# Patient Record
Sex: Female | Born: 1985 | Race: White | Hispanic: No | Marital: Married | Smoking: Never smoker
Health system: Southern US, Community
[De-identification: ages and names within clinical notes are randomized; demographics above are authoritative.]

## PROBLEM LIST (undated history)

## (undated) ENCOUNTER — Inpatient Hospital Stay (HOSPITAL_COMMUNITY): Payer: Self-pay

## (undated) DIAGNOSIS — F419 Anxiety disorder, unspecified: Secondary | ICD-10-CM

## (undated) DIAGNOSIS — O26649 Intrahepatic cholestasis of pregnancy, unspecified trimester: Secondary | ICD-10-CM

## (undated) DIAGNOSIS — K831 Obstruction of bile duct: Secondary | ICD-10-CM

## (undated) DIAGNOSIS — L709 Acne, unspecified: Secondary | ICD-10-CM

## (undated) DIAGNOSIS — B159 Hepatitis A without hepatic coma: Secondary | ICD-10-CM

## (undated) DIAGNOSIS — J45909 Unspecified asthma, uncomplicated: Secondary | ICD-10-CM

## (undated) HISTORY — DX: Anxiety disorder, unspecified: F41.9

## (undated) HISTORY — DX: Acne, unspecified: L70.9

## (undated) HISTORY — DX: Intrahepatic cholestasis of pregnancy, unspecified trimester: O26.649

## (undated) HISTORY — DX: Unspecified asthma, uncomplicated: J45.909

## (undated) HISTORY — PX: OTHER SURGICAL HISTORY: SHX169

## (undated) HISTORY — DX: Obstruction of bile duct: K83.1

## (undated) HISTORY — DX: Hepatitis a without hepatic coma: B15.9

## (undated) HISTORY — PX: WISDOM TOOTH EXTRACTION: SHX21

---

## 2013-11-28 ENCOUNTER — Telehealth: Payer: Self-pay | Admitting: Family Medicine

## 2013-11-28 NOTE — Telephone Encounter (Signed)
According to our old records system this patient has not been seen within the past 3 years and therefore would need to establish care during regular business hours before we can provide care during extended hours.

## 2013-11-28 NOTE — Telephone Encounter (Signed)
Correction: patient has been seen within past 3 years. Symptoms began last night with throat irritation and facial pressure. Suggested antihistamine, pseudoephedrine, and Advil OTC to treat symptoms. Increase fluid intake and avoid dairy. Virus vs allergies most likely at this point. Virus will worsen around the 4th day and then start to improve. If symptoms worsen or fail to improve she should be seen to r/o bacterial infection. Patient stated understanding and agreement to plan.

## 2013-11-28 NOTE — Telephone Encounter (Signed)
Left message for patient to return call. No visit within the past year. Does she have another PCP? If no and she has been seen within the past 3 years then she is eligible to be seen during Saturday clinic hours.

## 2014-04-30 ENCOUNTER — Ambulatory Visit (INDEPENDENT_AMBULATORY_CARE_PROVIDER_SITE_OTHER): Payer: Managed Care, Other (non HMO) | Admitting: Nurse Practitioner

## 2014-04-30 ENCOUNTER — Encounter: Payer: Self-pay | Admitting: Nurse Practitioner

## 2014-04-30 ENCOUNTER — Encounter (INDEPENDENT_AMBULATORY_CARE_PROVIDER_SITE_OTHER): Payer: Self-pay

## 2014-04-30 VITALS — BP 110/69 | HR 82 | Temp 97.7°F | Ht 65.0 in | Wt 159.0 lb

## 2014-04-30 DIAGNOSIS — J069 Acute upper respiratory infection, unspecified: Secondary | ICD-10-CM

## 2014-04-30 DIAGNOSIS — J029 Acute pharyngitis, unspecified: Secondary | ICD-10-CM

## 2014-04-30 MED ORDER — AMOXICILLIN 875 MG PO TABS: 875.0000 mg | ORAL_TABLET | Freq: Two times a day (BID) | ORAL | Status: DC

## 2014-04-30 NOTE — Patient Instructions (Signed)

## 2014-04-30 NOTE — Progress Notes (Signed)
   Subjective:    [name], female    DOB: 12/29/1985, 28 y.o.   MRN: [id number]  Sore throat, sinus pressure, dry cough, chills, for 3 days  Sinusitis This is a new problem. The current episode started in the past 7 days. The problem is unchanged. There has been no fever. The fever has been present for less than 1 day. Her pain is at a severity of 5/10. The pain is mild. Associated symptoms include chills, coughing, headaches, sinus pressure, sneezing and a sore throat. Past treatments include oral decongestants. The treatment provided no relief.      Review of Systems  Constitutional: Positive for chills.  HENT: Positive for sinus pressure, sneezing and sore throat.   Respiratory: Positive for cough.   Neurological: Positive for headaches.       Objective:   Physical Exam  Constitutional: She appears well-developed and well-nourished.  HENT:  Head: Normocephalic.  Right Ear: Tympanic membrane and ear canal normal. There is tenderness.  Left Ear: Tympanic membrane and ear canal normal. There is tenderness.  Mouth/Throat: Uvula is midline and mucous membranes are normal. Oropharyngeal exudate present.  Cardiovascular: Normal rate, regular rhythm and normal heart sounds.   Pulmonary/Chest: Effort normal and breath sounds normal.  Skin: Skin is warm and dry.  Psychiatric: She has a normal mood and affect. Her behavior is normal. Judgment and thought content normal.   BP 110/69  Pulse 82  Temp(Src) 97.7 F (36.5 C) (Oral)  Ht 5\' 5"  (1.651 m)  Wt 159 lb (72.122 kg)  BMI 26.46 kg/m2        Assessment & Plan:   1. Acute pharyngitis, unspecified pharyngitis type   2. Upper respiratory infection, acute    Meds ordered this encounter  Medications  . amoxicillin (AMOXIL) 875 MG tablet    Sig: Take 1 tablet (875 mg total) by mouth 2 (two) times daily.    Dispense:  20 tablet    Refill:  0    Order Specific Question:  Supervising Provider    Answer:   Ernestina PennaMOORE, DONALD W [[id number]]   1. Take meds as prescribed 2. Use a cool mist humidifier especially during the winter months and when heat has been humid. 3. Use saline nose sprays frequently 4. Saline irrigations of the nose can be very helpful if done frequently.  * 4X daily for 1 week*  * Use of a nettie pot can be helpful with this. Follow directions with this* 5. Drink plenty of fluids 6. Keep thermostat turn down low 7.For any cough or congestion  Use plain Mucinex- regular strength or max strength is fine   * Children- consult with Pharmacist for dosing 8. For fever or aces or pains- take tylenol or ibuprofen appropriate for age and weight.  * for fevers greater than 101 orally you may alternate ibuprofen and tylenol every  3 hours.   Mary-Margaret Daphine DeutscherMartin, FNP

## 2014-07-27 ENCOUNTER — Emergency Department (HOSPITAL_COMMUNITY)
Admission: EM | Admit: 2014-07-27 | Discharge: 2014-07-28 | Disposition: A | Discharge status: Home / Self Care | Payer: Managed Care, Other (non HMO) | Attending: Emergency Medicine | Admitting: Emergency Medicine

## 2014-07-27 ENCOUNTER — Encounter (HOSPITAL_COMMUNITY): Payer: Self-pay | Admitting: Emergency Medicine

## 2014-07-27 DIAGNOSIS — Z792 Long term (current) use of antibiotics: Secondary | ICD-10-CM | POA: Diagnosis not present

## 2014-07-27 DIAGNOSIS — R1010 Upper abdominal pain, unspecified: Secondary | ICD-10-CM | POA: Insufficient documentation

## 2014-07-27 DIAGNOSIS — Z3202 Encounter for pregnancy test, result negative: Secondary | ICD-10-CM | POA: Diagnosis not present

## 2014-07-27 DIAGNOSIS — R079 Chest pain, unspecified: Secondary | ICD-10-CM | POA: Diagnosis present

## 2014-07-27 DIAGNOSIS — Z79899 Other long term (current) drug therapy: Secondary | ICD-10-CM | POA: Diagnosis not present

## 2014-07-27 DIAGNOSIS — R109 Unspecified abdominal pain: Secondary | ICD-10-CM

## 2014-07-27 LAB — URINALYSIS, ROUTINE W REFLEX MICROSCOPIC
BILIRUBIN URINE: NEGATIVE
Glucose, UA: NEGATIVE mg/dL
Hgb urine dipstick: NEGATIVE
KETONES UR: 15 mg/dL — AB
Leukocytes, UA: NEGATIVE
NITRITE: NEGATIVE
PH: 7 (ref 5.0–8.0)
Protein, ur: NEGATIVE mg/dL
SPECIFIC GRAVITY, URINE: 1.022 (ref 1.005–1.030)
UROBILINOGEN UA: 0.2 mg/dL (ref 0.0–1.0)

## 2014-07-27 LAB — CBC WITH DIFFERENTIAL/PLATELET
BASOS ABS: 0 10*3/uL (ref 0.0–0.1)
BASOS PCT: 0 % (ref 0–1)
EOS ABS: 0.2 10*3/uL (ref 0.0–0.7)
Eosinophils Relative: 1 % (ref 0–5)
HCT: 40.2 % (ref 36.0–46.0)
HEMOGLOBIN: 13.5 g/dL (ref 12.0–15.0)
Lymphocytes Relative: 41 % (ref 12–46)
Lymphs Abs: 4.8 10*3/uL — ABNORMAL HIGH (ref 0.7–4.0)
MCH: 29.7 pg (ref 26.0–34.0)
MCHC: 33.6 g/dL (ref 30.0–36.0)
MCV: 88.4 fL (ref 78.0–100.0)
Monocytes Absolute: 0.6 10*3/uL (ref 0.1–1.0)
Monocytes Relative: 5 % (ref 3–12)
NEUTROS ABS: 6.2 10*3/uL (ref 1.7–7.7)
NEUTROS PCT: 53 % (ref 43–77)
PLATELETS: 294 10*3/uL (ref 150–400)
RBC: 4.55 MIL/uL (ref 3.87–5.11)
RDW: 12.8 % (ref 11.5–15.5)
WBC: 11.8 10*3/uL — ABNORMAL HIGH (ref 4.0–10.5)

## 2014-07-27 LAB — COMPREHENSIVE METABOLIC PANEL
ALK PHOS: 60 U/L (ref 39–117)
ALT: 17 U/L (ref 0–35)
AST: 16 U/L (ref 0–37)
Albumin: 3.9 g/dL (ref 3.5–5.2)
Anion gap: 14 (ref 5–15)
BILIRUBIN TOTAL: 0.3 mg/dL (ref 0.3–1.2)
BUN: 12 mg/dL (ref 6–23)
CO2: 21 mEq/L (ref 19–32)
Calcium: 9.1 mg/dL (ref 8.4–10.5)
Chloride: 103 mEq/L (ref 96–112)
Creatinine, Ser: 0.83 mg/dL (ref 0.50–1.10)
GFR calc Af Amer: >90 mL/min (ref >90)
GFR calc non Af Amer: >90 mL/min (ref >90)
Glucose, Bld: 94 mg/dL (ref 70–99)
POTASSIUM: 3.9 meq/L (ref 3.7–5.3)
Sodium: 138 mEq/L (ref 137–147)
TOTAL PROTEIN: 7.5 g/dL (ref 6.0–8.3)

## 2014-07-27 LAB — POC URINE PREG, ED: Preg Test, Ur: NEGATIVE

## 2014-07-27 LAB — I-STAT TROPONIN, ED: Troponin i, poc: 0 ng/mL (ref 0.00–0.08)

## 2014-07-27 LAB — LIPASE, BLOOD: LIPASE: 27 U/L (ref 11–59)

## 2014-07-27 MED ORDER — ONDANSETRON HCL 4 MG/2ML IJ SOLN
4.0000 mg | Freq: Once | INTRAMUSCULAR | Status: AC
Start: 2014-07-27 — End: 2014-07-27
  Administered 2014-07-27: 4 mg via INTRAVENOUS
  Filled 2014-07-27: qty 2

## 2014-07-27 NOTE — ED Notes (Signed)
Patient reports on Saturday morning she developed left upper quad pain that radiates to right upper quad and back. Pt describes pain as sharp, stabbing, intermittent at times. Eating makes the pain worse. Nausea with no vomiting.

## 2014-07-27 NOTE — ED Provider Notes (Signed)
CSN: [id number]     Arrival date & time 07/27/14  1908 History   First MD Initiated Contact with Patient 07/27/14 2153     Chief Complaint  Patient presents with  . Abdominal Pain  . Chest Pain  . Nausea     (Consider location/radiation/quality/duration/timing/severity/associated sxs/prior Treatment) HPI Comments: Patient presents to the emergency department with chief complaint of upper abdominal pain. She states the pain started on Saturday morning. She states that radiates to her back. She describes as being intermittent, sharp, and stabbing. She states that it is worsened with eating. She denies any nausea or vomiting. Denies any diarrhea, constipation, or dysuria. She has not tried taking anything to alleviate her symptoms. No prior abdominal surgeries. No pertinent past medical history.  The history is provided by the patient. No language interpreter was used.    History reviewed. No pertinent past medical history. History reviewed. No pertinent past surgical history. No family history on file. History  Substance Use Topics  . Smoking status: Never Smoker   . Smokeless tobacco: Not on file  . Alcohol Use: No   OB History    No data available     Review of Systems  Constitutional: Negative for fever and chills.  Respiratory: Negative for shortness of breath.   Cardiovascular: Negative for chest pain.  Gastrointestinal: Negative for nausea, vomiting, diarrhea and constipation.  Genitourinary: Negative for dysuria.  All other systems reviewed and are negative.     Allergies  Sulfur  Home Medications   Prior to Admission medications   Medication Sig Start Date End Date Taking? Authorizing Provider  ibuprofen (ADVIL,MOTRIN) 200 MG tablet Take 400 mg by mouth every 6 (six) hours as needed.   Yes Historical Provider, MD  Levonorgestrel-Ethinyl Estrad (SRONYX PO) Take 1 tablet by mouth daily.   Yes Historical Provider, MD  amoxicillin (AMOXIL) 875 MG tablet Take 1  tablet (875 mg total) by mouth 2 (two) times daily. 04/30/14   Mary-Margaret Daphine DeutscherMartin, FNP   BP 116/72 mmHg  Pulse 83  Temp(Src) 98.1 F (36.7 C) (Oral)  Resp 16  SpO2 98%  LMP 07/13/2014 Physical Exam  Constitutional: She is oriented to person, place, and time. She appears well-developed and well-nourished.  HENT:  Head: Normocephalic and atraumatic.  Eyes: Conjunctivae and EOM are normal. Pupils are equal, round, and reactive to light.  Neck: Normal range of motion. Neck supple.  Cardiovascular: Normal rate and regular rhythm.  Exam reveals no gallop and no friction rub.   No murmur heard. Pulmonary/Chest: Effort normal and breath sounds normal. No respiratory distress. She has no wheezes. She has no rales. She exhibits no tenderness.  Abdominal: Soft. Bowel sounds are normal. She exhibits no distension and no mass. There is no tenderness. There is no rebound and no guarding.  No focal abdominal tenderness, no RLQ tenderness or pain at McBurney's point, no RUQ tenderness or Murphy's sign, no left-sided abdominal tenderness, no fluid wave, or signs of peritonitis   Musculoskeletal: Normal range of motion. She exhibits no edema or tenderness.  Neurological: She is alert and oriented to person, place, and time.  Skin: Skin is warm and dry.  Psychiatric: She has a normal mood and affect. Her behavior is normal. Judgment and thought content normal.  Nursing note and vitals reviewed.   ED Course  Procedures (including critical care time) Results for orders placed or performed during the hospital encounter of 07/27/14  CBC with Differential  Result Value Ref Range   WBC  11.8 (H) 4.0 - 10.5 K/uL   RBC 4.55 3.87 - 5.11 MIL/uL   Hemoglobin 13.5 12.0 - 15.0 g/dL   HCT 16.140.2 09.636.0 - 04.546.0 %   MCV 88.4 78.0 - 100.0 fL   MCH 29.7 26.0 - 34.0 pg   MCHC 33.6 30.0 - 36.0 g/dL   RDW 40.912.8 81.111.5 - 91.415.5 %   Platelets 294 150 - 400 K/uL   Neutrophils Relative % 53 43 - 77 %   Neutro Abs 6.2 1.7 - 7.7  K/uL   Lymphocytes Relative 41 12 - 46 %   Lymphs Abs 4.8 (H) 0.7 - 4.0 K/uL   Monocytes Relative 5 3 - 12 %   Monocytes Absolute 0.6 0.1 - 1.0 K/uL   Eosinophils Relative 1 0 - 5 %   Eosinophils Absolute 0.2 0.0 - 0.7 K/uL   Basophils Relative 0 0 - 1 %   Basophils Absolute 0.0 0.0 - 0.1 K/uL  Comprehensive metabolic panel  Result Value Ref Range   Sodium 138 137 - 147 mEq/L   Potassium 3.9 3.7 - 5.3 mEq/L   Chloride 103 96 - 112 mEq/L   CO2 21 19 - 32 mEq/L   Glucose, Bld 94 70 - 99 mg/dL   BUN 12 6 - 23 mg/dL   Creatinine, Ser 7.820.83 0.50 - 1.10 mg/dL   Calcium 9.1 8.4 - 95.610.5 mg/dL   Total Protein 7.5 6.0 - 8.3 g/dL   Albumin 3.9 3.5 - 5.2 g/dL   AST 16 0 - 37 U/L   ALT 17 0 - 35 U/L   Alkaline Phosphatase 60 39 - 117 U/L   Total Bilirubin 0.3 0.3 - 1.2 mg/dL   GFR calc non Af Amer >90 >90 mL/min   GFR calc Af Amer >90 >90 mL/min   Anion gap 14 5 - 15  Lipase, blood  Result Value Ref Range   Lipase 27 11 - 59 U/L  Urinalysis, Routine w reflex microscopic  Result Value Ref Range   Color, Urine YELLOW YELLOW   APPearance CLOUDY (A) CLEAR   Specific Gravity, Urine 1.022 1.005 - 1.030   pH 7.0 5.0 - 8.0   Glucose, UA NEGATIVE NEGATIVE mg/dL   Hgb urine dipstick NEGATIVE NEGATIVE   Bilirubin Urine NEGATIVE NEGATIVE   Ketones, ur 15 (A) NEGATIVE mg/dL   Protein, ur NEGATIVE NEGATIVE mg/dL   Urobilinogen, UA 0.2 0.0 - 1.0 mg/dL   Nitrite NEGATIVE NEGATIVE   Leukocytes, UA NEGATIVE NEGATIVE  I-stat troponin, ED (only if pt is 27 y.o. or older & pain is above umbilicus) - [facility]  Result Value Ref Range   Troponin i, poc 0.00 0.00 - 0.08 ng/mL   Comment 3          POC Urine Pregnancy, ED  (If Pre-menopausal female) - [facility]  Result Value Ref Range   Preg Test, Ur NEGATIVE NEGATIVE   No results found.   Imaging Review No results found.   EKG Interpretation None      MDM   Final diagnoses:  Abdominal pain, unspecified abdominal  location    Patient with upper abdominal pain. Worsened with eating. No focal abdominal pain now. Patient is asymptomatic currently. Suspect GERD/ulcer. Plan for treatment with omeprazole. No focal abdominal tenderness. Vitals are all reassuring. Patient is well-appearing, not in a parent distress.   Roxy Horsemanobert Ladesha Pacini, PA-C 07/28/14 0003

## 2014-07-27 NOTE — ED Notes (Signed)
Pt initially had LUQ pain that started Saturday and now the abd pain radiates to RUQ and up sternum after patient eats.  Pt states it will last for little while then subside. Pt states she is nauseated also but denies v/d.

## 2014-07-27 NOTE — ED Notes (Signed)
Last meal: 12:30pm

## 2014-07-28 MED ORDER — OMEPRAZOLE 20 MG PO CPDR: 20.0000 mg | DELAYED_RELEASE_CAPSULE | Freq: Every day | ORAL | Status: DC

## 2014-07-28 NOTE — Discharge Instructions (Signed)

## 2015-02-25 ENCOUNTER — Ambulatory Visit (INDEPENDENT_AMBULATORY_CARE_PROVIDER_SITE_OTHER): Payer: Managed Care, Other (non HMO) | Admitting: Family Medicine

## 2015-02-25 ENCOUNTER — Ambulatory Visit (INDEPENDENT_AMBULATORY_CARE_PROVIDER_SITE_OTHER): Payer: Managed Care, Other (non HMO)

## 2015-02-25 ENCOUNTER — Encounter: Payer: Self-pay | Admitting: Family Medicine

## 2015-02-25 VITALS — BP 108/71 | HR 74 | Temp 99.4°F | Ht 65.0 in | Wt 157.0 lb

## 2015-02-25 DIAGNOSIS — M25571 Pain in right ankle and joints of right foot: Secondary | ICD-10-CM

## 2015-02-25 NOTE — Patient Instructions (Signed)
Ankle Exercises for Rehabilitation Following ankle injuries, it is as important to follow your caregiver's instructions for regaining full use of your ankle as it was to follow the initial treatment plan following the injury. The following are some suggestions for exercises and treatment, which can be done to help you regain full use of your ankle as soon as possible.  Follow all instructions regarding physical therapy.  Before exercising, it may be helpful to use heat on the muscles or joint being exercised. This loosens up the muscles and tendons (cordlike structure) and decreases chances of injury during your exercises. If this is not possible, just begin your exercises slowly to gradually warm up.  Stand on your toes several times per day to strengthen the calf muscles. These are the muscles in the back of your leg between the knee and the heel. The cord you can feel just above the heel is the Achilles tendon. Rise up on your toes several times repeating this three to four times per day. Do not exercise to the point of pain. If pain starts to develop, decrease the exercise until you are comfortable again.  Do range of motion exercises. This means moving the ankle in all directions. Practice writing the alphabet with your toes in the air. Do not increase beyond a range that is comfortable.  Increase the strength of the muscles in the front of your leg by raising your toes and foot straight up in the air. Repeat this exercise as you did the calf exercise with the same warnings. This also help to stretch your muscles.  Stretch your calf muscles also by leaning against a wall with your hands in front of you. Put your feet a few feet from the wall and bend your knees until you feel the muscles in your calves become tight.  After exercising it may be helpful to put ice on the ankle to prevent swelling and improve rehabilitation. This may be done for 15 to 20 minutes following your exercises. If  exercising is being done in the workplace, this may not always be possible.  Taping an ankle injury may be helpful to give added support following an injury. It also may help prevent reinjury. This may be true if you are in training or in a conditioning program. You and your caregiver can decide on the best course of action to follow. Document Released: 09/08/2000 Document Revised: 01/26/2014 Document Reviewed: 09/05/2008 Exit[employer] Patient Information 2015 [employer]. This information is not intended to replace advice given to you by your health care provider. Make sure you discuss any questions you have with your health care provider.  

## 2015-02-25 NOTE — Progress Notes (Signed)
   Subjective:    Patient ID: Alyssa MichaelisAlison Bullins [name], female    DOB: 05/16/1986, 29 y.o.   MRN: [id number]  HPI 29 year old female who was playing with her husband and kicked him striking her right lateral foot ankle on his knee. This was about 3 months ago. It still causes pain when she stands on it all day.    Review of Systems  Constitutional: Negative.   HENT: Negative.   Eyes: Negative.   Respiratory: Negative.   Cardiovascular: Negative.   Gastrointestinal: Negative.   Endocrine: Negative.   Genitourinary: Negative.   Hematological: Negative.   Psychiatric/Behavioral: Negative.    There are no active problems to display for this patient.  Outpatient Encounter Prescriptions as of 02/25/2015  Medication Sig  . doxycycline (DORYX) 100 MG EC tablet Take 100 mg by mouth daily.  Marland Kitchen. tretinoin (RETIN-A) 0.05 % cream   . [DISCONTINUED] amoxicillin (AMOXIL) 875 MG tablet Take 1 tablet (875 mg total) by mouth 2 (two) times daily.  . [DISCONTINUED] ibuprofen (ADVIL,MOTRIN) 200 MG tablet Take 400 mg by mouth every 6 (six) hours as needed.  . [DISCONTINUED] Levonorgestrel-Ethinyl Estrad (SRONYX PO) Take 1 tablet by mouth daily.  . [DISCONTINUED] omeprazole (PRILOSEC) 20 MG capsule Take 1 capsule (20 mg total) by mouth daily.   No facility-administered encounter medications on file as of 02/25/2015.       Objective:   Physical Exam  Constitutional: She appears well-developed and well-nourished.  Musculoskeletal:  Exam of right ankle and foot there is no swelling or discoloration wrist tenderness around the lateral malleolus really inferior to it. The fifth metatarsal does not seem tender to palpation. There is normal strength in all planes around the foot and ankle          Assessment & Plan:  1. Pain in joint, ankle and foot, right X-ray is negative. I suspect this represents contusion and possibly some sprain and strain of soft tissues. We discussed some range of motion as well as  exercises and given instructions as well when use ice as needed for pain and Tylenol rather than NSAIDs  Frederica KusterStephen M Vernette Moise MD - DG Ankle Complete Right; Future

## 2016-06-13 ENCOUNTER — Encounter: Payer: Self-pay | Admitting: Physician Assistant

## 2016-06-13 ENCOUNTER — Ambulatory Visit (INDEPENDENT_AMBULATORY_CARE_PROVIDER_SITE_OTHER): Payer: Managed Care, Other (non HMO) | Admitting: Physician Assistant

## 2016-06-13 ENCOUNTER — Encounter (INDEPENDENT_AMBULATORY_CARE_PROVIDER_SITE_OTHER): Payer: Self-pay

## 2016-06-13 ENCOUNTER — Ambulatory Visit: Payer: Managed Care, Other (non HMO) | Admitting: Physician Assistant

## 2016-06-13 VITALS — BP 116/72 | HR 86 | Temp 98.3°F | Ht 65.0 in | Wt 161.4 lb

## 2016-06-13 DIAGNOSIS — Z8709 Personal history of other diseases of the respiratory system: Secondary | ICD-10-CM

## 2016-06-13 DIAGNOSIS — J209 Acute bronchitis, unspecified: Secondary | ICD-10-CM | POA: Diagnosis not present

## 2016-06-13 MED ORDER — ALBUTEROL SULFATE (2.5 MG/3ML) 0.083% IN NEBU: 2.5000 mg | INHALATION_SOLUTION | Freq: Four times a day (QID) | RESPIRATORY_TRACT | 1 refills | Status: DC | PRN

## 2016-06-13 MED ORDER — DOXYCYCLINE HYCLATE 100 MG PO TBEC: 100.0000 mg | DELAYED_RELEASE_TABLET | Freq: Two times a day (BID) | ORAL | 0 refills | Status: DC

## 2016-06-13 MED ORDER — BENZONATATE 200 MG PO CAPS: 200.0000 mg | ORAL_CAPSULE | Freq: Three times a day (TID) | ORAL | 0 refills | Status: DC | PRN

## 2016-06-13 NOTE — Patient Instructions (Signed)

## 2016-06-13 NOTE — Progress Notes (Signed)
BP 116/72   Pulse 86   Temp 98.3 F (36.8 C) (Oral)   Ht 5\' 5"  (1.651 m)   Wt 161 lb 6.4 oz (73.2 kg)   BMI 26.86 kg/m    Subjective:    [name], female    DOB: 10/17/1985, 30 y.o.   MRN: [id number]  HPI: Alyssa [name] is a 30 y.o. female presenting on 06/13/2016 for Cough; stuffy head; and Headache  Sick greater than one week with cough worsening. States that it feels like when she was young and had tightness with breathing from her asthma. Has mostly grown out of the asthma.  Has had chills and aching. The cough is now productive.  No NVD.  Relevant past medical, surgical, family and social history reviewed and updated as indicated. Interim medical history since our last visit reviewed. Allergies and medications reviewed and updated. DATA REVIEWED: CHART IN EPIC  Social History   Social History  . Marital status: Married    Spouse name: N/A  . Number of children: N/A  . Years of education: N/A   Occupational History  . Not on file.   Social History Main Topics  . Smoking status: Never Smoker  . Smokeless tobacco: Never Used  . Alcohol use No  . Drug use: No  . Sexual activity: Not on file   Other Topics Concern  . Not on file   Social History Narrative  . No narrative on file    History reviewed. No pertinent surgical history.  Family History  Problem Relation Age of Onset  . Healthy Mother   . Healthy Father   . Healthy Sister     Review of Systems  Constitutional: Negative.  Negative for activity change, fatigue and fever.  HENT: Positive for congestion, ear pain, rhinorrhea, sinus pressure and sore throat.   Eyes: Negative.   Respiratory: Positive for cough and wheezing. Negative for shortness of breath and stridor.   Cardiovascular: Negative.  Negative for chest pain.  Gastrointestinal: Negative.  Negative for abdominal pain.  Endocrine: Negative.   Genitourinary: Negative.  Negative for dysuria.    Musculoskeletal: Negative.   Skin: Negative.   Neurological: Negative.       Medication List       Accurate as of 06/13/16  3:14 PM. Always use your most recent med list.          albuterol (2.5 MG/3ML) 0.083% nebulizer solution Commonly known as:  PROVENTIL Take 3 mLs (2.5 mg total) by nebulization every 6 (six) hours as needed for wheezing or shortness of breath.   benzonatate 200 MG capsule Commonly known as:  TESSALON Take 1 capsule (200 mg total) by mouth 3 (three) times daily as needed for cough.   doxycycline 100 MG EC tablet Commonly known as:  DORYX Take 1 tablet (100 mg total) by mouth 2 (two) times daily.          Objective:    BP 116/72   Pulse 86   Temp 98.3 F (36.8 C) (Oral)   Ht 5\' 5"  (1.651 m)   Wt 161 lb 6.4 oz (73.2 kg)   BMI 26.86 kg/m   Allergies  Allergen Reactions  . Sulfur Rash    Wt Readings from Last 3 Encounters:  06/13/16 161 lb 6.4 oz (73.2 kg)  02/25/15 157 lb (71.2 kg)  04/30/14 159 lb (72.1 kg)    Physical Exam  Constitutional: She is oriented to person, place, and time. She appears  well-developed and well-nourished.  HENT:  Head: Normocephalic and atraumatic.  Right Ear: There is drainage and tenderness. A middle ear effusion is present.  Left Ear: There is drainage and tenderness. A middle ear effusion is present.  Nose: Mucosal edema and rhinorrhea present. Right sinus exhibits maxillary sinus tenderness. Left sinus exhibits maxillary sinus tenderness.  Mouth/Throat: Oropharyngeal exudate and posterior oropharyngeal erythema present.  Eyes: Conjunctivae and EOM are normal. Pupils are equal, round, and reactive to light.  Neck: Normal range of motion. Neck supple.  Cardiovascular: Normal rate, regular rhythm, normal heart sounds and intact distal pulses.   Pulmonary/Chest: Effort normal. She has wheezes in the right upper field and the left upper field.  Abdominal: Soft. Bowel sounds are normal.  Neurological: She is  alert and oriented to person, place, and time. She has normal reflexes.  Skin: Skin is warm and dry. No rash noted.  Psychiatric: She has a normal mood and affect. Her behavior is normal. Judgment and thought content normal.  Nursing note and vitals reviewed.       Assessment & Plan:   1. Acute bronchitis, unspecified organism - doxycycline (DORYX) 100 MG EC tablet; Take 1 tablet (100 mg total) by mouth 2 (two) times daily.  Dispense: 20 tablet; Refill: 0 - albuterol (PROVENTIL) (2.5 MG/3ML) 0.083% nebulizer solution; Take 3 mLs (2.5 mg total) by nebulization every 6 (six) hours as needed for wheezing or shortness of breath.  Dispense: 150 mL; Refill: 1 - benzonatate (TESSALON) 200 MG capsule; Take 1 capsule (200 mg total) by mouth 3 (three) times daily as needed for cough.  Dispense: 30 capsule; Refill: 0  2. History of asthma Use albuterol nebulizer QID for cough and wheeze   Continue all other maintenance medications as listed above.  Follow up plan: Return if symptoms worsen or fail to improve.  Educational handout given for bronchitis/  Remus Loffler PA-C [facility] [address]  [place], [place] [address] [phone]   06/13/2016, 3:14 PM

## 2016-10-03 ENCOUNTER — Ambulatory Visit (INDEPENDENT_AMBULATORY_CARE_PROVIDER_SITE_OTHER): Payer: Managed Care, Other (non HMO) | Admitting: Physician Assistant

## 2016-10-03 ENCOUNTER — Encounter: Payer: Self-pay | Admitting: Physician Assistant

## 2016-10-03 VITALS — BP 131/73 | HR 106 | Temp 99.3°F | Ht 65.0 in | Wt 166.2 lb

## 2016-10-03 DIAGNOSIS — L03312 Cellulitis of back [any part except buttock]: Secondary | ICD-10-CM | POA: Diagnosis not present

## 2016-10-03 MED ORDER — CEPHALEXIN 500 MG PO CAPS: 500.0000 mg | ORAL_CAPSULE | Freq: Four times a day (QID) | ORAL | 0 refills | Status: DC

## 2016-10-03 NOTE — Patient Instructions (Signed)

## 2016-10-03 NOTE — Progress Notes (Signed)
BP 131/73   Pulse (!) 106   Temp 99.3 F (37.4 C) (Oral)   Ht 5\' 5"  (1.651 m)   Wt 166 lb 3.2 oz (75.4 kg)   BMI 27.66 kg/m    Subjective:    [name], female    DOB: 13-Dec-1985, 31 y.o.   MRN: [id number]  HPI: Florence Yeung is a 31 y.o. female presenting on 10/03/2016 for Rash (Painful rash- right side of back ) Patient with skin lesion on right upper back over the past 5 days. It started pustular. It did drain and then has healed up. There has been increasing redness around the area. She denies any severe radicular pain she denies fever or chills. There were no vesicular lesions associated with this.  Relevant past medical, surgical, family and social history reviewed and updated as indicated. Allergies and medications reviewed and updated.  Past Medical History:  Diagnosis Date  . Acne     History reviewed. No pertinent surgical history.  Review of Systems  Constitutional: Negative.  Negative for activity change, fatigue and fever.  HENT: Negative.   Eyes: Negative.   Respiratory: Negative.  Negative for cough.   Cardiovascular: Negative.  Negative for chest pain.  Gastrointestinal: Negative.  Negative for abdominal pain.  Endocrine: Negative.   Genitourinary: Negative.  Negative for dysuria.  Musculoskeletal: Negative.   Skin: Negative.   Neurological: Negative.     Allergies as of 10/03/2016      Reactions   Sulfur Rash      Medication List       Accurate as of 10/03/16  3:23 PM. Always use your most recent med list.          ACZONE 7.5 % Gel Generic drug:  Dapsone APPLY TO THE AFFECTED AREA EVERY DAY   albuterol (2.5 MG/3ML) 0.083% nebulizer solution Commonly known as:  PROVENTIL Take 3 mLs (2.5 mg total) by nebulization every 6 (six) hours as needed for wheezing or shortness of breath.   cephALEXin 500 MG capsule Commonly known as:  KEFLEX Take 1 capsule (500 mg total) by mouth 4 (four) times daily.   clindamycin 1 %  gel Commonly known as:  CLINDAGEL APPLY TO THE AFFECTED AREA EVERY NIGHT          Objective:    BP 131/73   Pulse (!) 106   Temp 99.3 F (37.4 C) (Oral)   Ht 5\' 5"  (1.651 m)   Wt 166 lb 3.2 oz (75.4 kg)   BMI 27.66 kg/m   Allergies  Allergen Reactions  . Sulfur Rash    Physical Exam  Constitutional: She is oriented to person, place, and time. She appears well-developed and well-nourished.  HENT:  Head: Normocephalic and atraumatic.  Eyes: Conjunctivae and EOM are normal. Pupils are equal, round, and reactive to light.  Cardiovascular: Normal rate, regular rhythm, normal heart sounds and intact distal pulses.   Pulmonary/Chest: Effort normal and breath sounds normal.  Abdominal: Soft. Bowel sounds are normal.  Neurological: She is alert and oriented to person, place, and time. She has normal reflexes.  Skin: Skin is warm, dry and intact. Rash noted. Rash is maculopapular and pustular. Rash is not vesicular. There is erythema.     Psychiatric: She has a normal mood and affect. Her behavior is normal. Judgment and thought content normal.        Assessment & Plan:   1. Cellulitis of back except buttock - cephALEXin (KEFLEX) 500 MG capsule;  Take 1 capsule (500 mg total) by mouth 4 (four) times daily.  Dispense: 40 capsule; Refill: 0   Continue all other maintenance medications as listed above.  Follow up plan: Return if symptoms worsen or fail to improve.  No orders of the defined types were placed in this encounter.   Educational handout given for cellulitis  Remus LofflerAngel S. Szymon Foiles PA-C [facility] [address]  [place], [place] [id number] [phone]   10/03/2016, 3:23 PM

## 2017-04-03 LAB — OB RESULTS CONSOLE RPR: RPR: NONREACTIVE

## 2017-04-03 LAB — OB RESULTS CONSOLE HEPATITIS B SURFACE ANTIGEN: Hepatitis B Surface Ag: NEGATIVE

## 2017-04-03 LAB — OB RESULTS CONSOLE GC/CHLAMYDIA
CHLAMYDIA, DNA PROBE: NEGATIVE
Gonorrhea: NEGATIVE

## 2017-04-03 LAB — OB RESULTS CONSOLE ABO/RH: RH Type: POSITIVE

## 2017-04-03 LAB — OB RESULTS CONSOLE HIV ANTIBODY (ROUTINE TESTING): HIV: NONREACTIVE

## 2017-04-03 LAB — OB RESULTS CONSOLE RUBELLA ANTIBODY, IGM: RUBELLA: IMMUNE

## 2017-04-03 LAB — OB RESULTS CONSOLE ANTIBODY SCREEN: Antibody Screen: NEGATIVE

## 2017-07-02 ENCOUNTER — Ambulatory Visit (INDEPENDENT_AMBULATORY_CARE_PROVIDER_SITE_OTHER): Payer: 59 | Admitting: Family

## 2017-07-02 ENCOUNTER — Encounter: Payer: Self-pay | Admitting: Family

## 2017-07-02 VITALS — BP 112/66 | HR 90 | Temp 98.3°F | Ht 65.0 in | Wt 179.8 lb

## 2017-07-02 DIAGNOSIS — J189 Pneumonia, unspecified organism: Secondary | ICD-10-CM | POA: Diagnosis not present

## 2017-07-02 DIAGNOSIS — R062 Wheezing: Secondary | ICD-10-CM

## 2017-07-02 MED ORDER — ALBUTEROL SULFATE HFA 108 (90 BASE) MCG/ACT IN AERS: 2.0000 | INHALATION_SPRAY | Freq: Four times a day (QID) | RESPIRATORY_TRACT | 2 refills | Status: DC | PRN

## 2017-07-02 MED ORDER — AMOXICILLIN 400 MG/5ML PO SUSR: 1000.0000 mg | Freq: Two times a day (BID) | ORAL | 0 refills | Status: DC

## 2017-07-02 NOTE — Patient Instructions (Signed)

## 2017-07-02 NOTE — Progress Notes (Signed)
   Subjective:    [name], female    DOB: 20-Apr-1986, 31 y.o.   MRN: [id number]  Pt presents to the office today with cough and wheezing. She is currently [redacted] weeks pregnant.  Cough  This is a new problem. The problem has been gradually improving. The problem occurs every few minutes. The cough is productive of purulent sputum and productive of sputum. Associated symptoms include chills, rhinorrhea, shortness of breath and wheezing. Pertinent negatives include no ear congestion, ear pain, fever, myalgias, nasal congestion, postnasal drip or sore throat. The symptoms are aggravated by lying down. She has tried rest and OTC cough suppressant for the symptoms. The treatment provided mild relief. Her past medical history is significant for asthma.  Abdominal Pain  Pertinent negatives include no fever or myalgias.      Review of Systems  Constitutional: Positive for chills. Negative for fever.  HENT: Positive for rhinorrhea. Negative for ear pain, postnasal drip and sore throat.   Respiratory: Positive for cough, shortness of breath and wheezing.   Gastrointestinal: Positive for abdominal pain.  Musculoskeletal: Negative for myalgias.  All other systems reviewed and are negative.      Objective:   Physical Exam  Constitutional: She is oriented to person, place, and time. She appears well-developed and well-nourished. No distress.  HENT:  Head: Normocephalic and atraumatic.  Right Ear: External ear normal.  Left Ear: External ear normal.  Nose: Mucosal edema and rhinorrhea present.  Mouth/Throat: Oropharynx is clear and moist.  Eyes: Pupils are equal, round, and reactive to light.  Neck: Normal range of motion. Neck supple. No thyromegaly present.  Cardiovascular: Normal rate, regular rhythm, normal heart sounds and intact distal pulses.   No murmur heard. Pulmonary/Chest: Effort normal. No respiratory distress. She has wheezes.  Abdominal: Soft. Bowel sounds are  normal. She exhibits no distension. There is no tenderness.  Musculoskeletal: Normal range of motion. She exhibits no edema or tenderness.  Neurological: She is alert and oriented to person, place, and time. She has normal reflexes. No cranial nerve deficit.  Skin: Skin is warm and dry.  Psychiatric: She has a normal mood and affect. Her behavior is normal. Judgment and thought content normal.  Vitals reviewed.     BP 112/66   Pulse 90   Temp 98.3 F (36.8 C) (Oral)   Ht  (1.651 m)   Wt 179 lb 12.8 oz (81.6 kg)   BMI 29.92 kg/m      Assessment & Plan:  1. Wheezing - amoxicillin (AMOXIL) 400 MG/5ML suspension; Take 12.5 mLs (1,000 mg total) by mouth 2 (two) times daily.  Dispense: 150 mL; Refill: 0 - albuterol (PROVENTIL HFA;VENTOLIN HFA) 108 (90 Base) MCG/ACT inhaler; Inhale 2 puffs into the lungs every 6 (six) hoursas needed for wheezing or shortness of breath.  Dispense: 1 Inhaler; Refill: 2  2. Atypical pneumonia - amoxicillin (AMOXIL) 400 MG/5ML suspension; Take 12.5 mLs (1,000 mg total) by mouth 2 (two) times daily.  Dispense: 150 mL; Refill: 0 - albuterol (PROVENTIL HFA;VENTOLIN HFA) 108 (90 Base) MCG/ACT inhaler; Inhale 2 puffs into the lungs every 6 (six) hours as needed for wheezing or shortness of breath.  Dispense: 1 Inhaler; Refill: 2   Will start on amoxicillin Prednisone is category D will hold off  Since pt is pregnant will not do chest x-ray Force fluids Rest Report any changes in wheezing or fevers RTO prn   Jannifer Rodney, FNP

## 2017-09-12 DIAGNOSIS — K831 Obstruction of bile duct: Secondary | ICD-10-CM | POA: Diagnosis present

## 2017-09-12 DIAGNOSIS — O26649 Intrahepatic cholestasis of pregnancy, unspecified trimester: Secondary | ICD-10-CM | POA: Diagnosis present

## 2017-09-25 NOTE — L&D Delivery Note (Signed)
Delivery Note Pt pushed very well for 10-7415min.  At 3:56 PM a viable and healthy female was delivered via Vaginal, Spontaneous (Presentation: OA; LOT ).  APGAR: 8, 9; weight 6 lb 7.9 oz (2945 g).   Placenta status: delivered, intact .  Cord: 3V with the following complications: none..    Anesthesia:  epidural Episiotomy: None Lacerations: 2nd degree, B labial Suture Repair: 3.0 vicryl rapide Est. Blood Loss (mL): 150cc  Mom to postpartum.  Baby to Couplet care / Skin to Skin.  Shaleka Brines Bovard-Stuckert 10/10/2017, 4:29 PM  B+/RI/Tdap in [facility]/Br/contra?

## 2017-10-01 ENCOUNTER — Telehealth (HOSPITAL_COMMUNITY): Payer: Self-pay | Admitting: *Deleted

## 2017-10-01 ENCOUNTER — Encounter (HOSPITAL_COMMUNITY): Payer: Self-pay | Admitting: *Deleted

## 2017-10-01 LAB — OB RESULTS CONSOLE GBS: GBS: NEGATIVE

## 2017-10-01 NOTE — Telephone Encounter (Signed)
Preadmission screen  

## 2017-10-09 DIAGNOSIS — O26619 Liver and biliary tract disorders in pregnancy, unspecified trimester: Secondary | ICD-10-CM

## 2017-10-09 DIAGNOSIS — K831 Obstruction of bile duct: Secondary | ICD-10-CM | POA: Diagnosis present

## 2017-10-09 NOTE — H&P (Signed)
Alyssa [name] is a 32 y.o. female G1P0 at 9 weeks presenting for IOL given cholestasis of pregnancy, controlled with ursodiol bid.Pregnancy also complicated by RNI, vaccinne PP.She declined genetic screening in [facility].  Dated by LMP c/w early [place].  Tdap in [facility].   OB History    Gravida Para Term Preterm AB Living   1             SAB TAB Ectopic Multiple Live Births                +abn Pap, f/u WNL No STDs  G1 present, cholestasis  Past Medical History:  Diagnosis Date  . Acne   . Asthma   . Cholestasis    Past Surgical History:  Procedure Laterality Date  . WISDOM TOOTH EXTRACTION     Family History: family history includes Healthy in her father, mother, and sister.CAD, HTN Social History:  reports that  has never smoked. she has never used smokeless tobacco. She reports that she does not drink alcohol or use drugs.married, customer service [employer]  Meds PNV, Protonix, Ursodiol All NKDA     Maternal Diabetes: No Genetic Screening: Declined Maternal Ultrasounds/Referrals: Normal Fetal Ultrasounds or other Referrals:  None Maternal Substance Abuse:  No Significant Maternal Medications:  None Significant Maternal Lab Results:  Lab values include: Group B Strep negative Other Comments:  None  Review of Systems  Constitutional: Negative.   Eyes: Negative.   Respiratory: Negative.   Cardiovascular: Negative.   Gastrointestinal: Negative.   Genitourinary: Negative.   Musculoskeletal: Positive for back pain.  Skin: Positive for itching.  Neurological: Negative.   Endo/Heme/Allergies: Negative.   Psychiatric/Behavioral: Negative.    Maternal Medical History:  Contractions: Frequency: irregular.   Perceived severity is moderate.    Fetal activity: Perceived fetal activity is normal.    Prenatal complications: CHOLESTASIS  Prenatal Complications - Diabetes: none.      There were no vitals taken for this visit. Maternal Exam:  Uterine Assessment:  Contraction frequency is irregular.   Abdomen: Patient reports no abdominal tenderness. Fundal height is appropriate for gestation.   Estimated fetal weight is 7#.   Fetal presentation: vertex  Introitus: Normal vulva. Normal vagina.  Cervix: Cervix evaluated by digital exam.     Physical Exam  Constitutional: She is oriented to person, place, and time. She appears well-developed and well-nourished.  HENT:  Head: Normocephalic and atraumatic.  Cardiovascular: Normal rate and regular rhythm.  Respiratory: Effort normal and breath sounds normal. No respiratory distress. She has no wheezes.  GI: Soft. Bowel sounds are normal. She exhibits no distension. There is no tenderness.  Musculoskeletal: Normal range of motion.  Neurological: She is alert and oriented to person, place, and time.  Skin: Skin is warm and dry.  Psychiatric: She has a normal mood and affect. Her behavior is normal.    Prenatal labs: ABO, Rh: B/Positive/-- (07/10 0000) Antibody: Negative (07/10 0000) Rubella: Immune (07/10 0000) nonimmune RPR: Nonreactive (07/10 0000)  HBsAg: Negative (07/10 0000)  HIV: Non-reactive (07/10 0000)  GBS:   negative  Hgb 13.4/Plt352/Ur Cx neg/GC neg/Chl neg/Varicella immune/glucola 93/  Nl anat, ant plac, female  Assessment/Plan: IOL with AROM and pitocin RNI - MMR PP GBBS neg Epsidural, nitrous and stadol prn Expect SVD   Yittel Emrich Bovard-Stuckert 10/09/2017, 8:14 AM

## 2017-10-10 ENCOUNTER — Inpatient Hospital Stay (HOSPITAL_COMMUNITY): Admission: RE | Admit: 2017-10-10 | Payer: Managed Care, Other (non HMO) | Source: Ambulatory Visit

## 2017-10-10 ENCOUNTER — Inpatient Hospital Stay (HOSPITAL_COMMUNITY)
Admission: RE | Admit: 2017-10-10 | Discharge: 2017-10-12 | DRG: 805 | Disposition: A | Discharge status: Home / Self Care | Payer: 59 | Source: Ambulatory Visit | Attending: Obstetrics and Gynecology | Admitting: Obstetrics and Gynecology

## 2017-10-10 ENCOUNTER — Inpatient Hospital Stay (HOSPITAL_COMMUNITY): Payer: 59 | Admitting: Anesthesiology

## 2017-10-10 ENCOUNTER — Encounter (HOSPITAL_COMMUNITY): Payer: Self-pay

## 2017-10-10 DIAGNOSIS — O9952 Diseases of the respiratory system complicating childbirth: Secondary | ICD-10-CM | POA: Diagnosis present

## 2017-10-10 DIAGNOSIS — K831 Obstruction of bile duct: Secondary | ICD-10-CM | POA: Diagnosis present

## 2017-10-10 DIAGNOSIS — O2662 Liver and biliary tract disorders in childbirth: Secondary | ICD-10-CM | POA: Diagnosis present

## 2017-10-10 DIAGNOSIS — J45909 Unspecified asthma, uncomplicated: Secondary | ICD-10-CM | POA: Diagnosis present

## 2017-10-10 DIAGNOSIS — Z3A37 37 weeks gestation of pregnancy: Secondary | ICD-10-CM | POA: Diagnosis not present

## 2017-10-10 DIAGNOSIS — O26619 Liver and biliary tract disorders in pregnancy, unspecified trimester: Secondary | ICD-10-CM

## 2017-10-10 LAB — TYPE AND SCREEN
ABO/RH(D): B POS
Antibody Screen: NEGATIVE

## 2017-10-10 LAB — CBC
HEMATOCRIT: 32.3 % — AB (ref 36.0–46.0)
Hemoglobin: 10.7 g/dL — ABNORMAL LOW (ref 12.0–15.0)
MCH: 27.9 pg (ref 26.0–34.0)
MCHC: 33.1 g/dL (ref 30.0–36.0)
MCV: 84.1 fL (ref 78.0–100.0)
Platelets: 338 10*3/uL (ref 150–400)
RBC: 3.84 MIL/uL — AB (ref 3.87–5.11)
RDW: 13.9 % (ref 11.5–15.5)
WBC: 14.1 10*3/uL — AB (ref 4.0–10.5)

## 2017-10-10 LAB — ABO/RH: ABO/RH(D): B POS

## 2017-10-10 MED ORDER — URSODIOL 250 MG PO TABS: 250.0000 mg | ORAL_TABLET | Freq: Two times a day (BID) | ORAL | Status: DC

## 2017-10-10 MED ORDER — LACTATED RINGERS IV SOLN: 500.0000 mL | INTRAVENOUS | Status: DC | PRN

## 2017-10-10 MED ORDER — EPHEDRINE 5 MG/ML INJ
10.0000 mg | INTRAVENOUS | Status: DC | PRN
  Filled 2017-10-10: qty 2

## 2017-10-10 MED ORDER — OXYTOCIN BOLUS FROM INFUSION
500.0000 mL | Freq: Once | INTRAVENOUS | Status: AC
  Administered 2017-10-10: 500 mL via INTRAVENOUS

## 2017-10-10 MED ORDER — OXYTOCIN 40 UNITS IN LACTATED RINGERS INFUSION - SIMPLE MED
1.0000 m[IU]/min | INTRAVENOUS | Status: DC
  Administered 2017-10-10: 2 m[IU]/min via INTRAVENOUS
  Filled 2017-10-10: qty 1000

## 2017-10-10 MED ORDER — BUTORPHANOL TARTRATE 1 MG/ML IJ SOLN
1.0000 mg | INTRAMUSCULAR | Status: DC | PRN
  Administered 2017-10-10: 1 mg via INTRAVENOUS
  Filled 2017-10-10: qty 1

## 2017-10-10 MED ORDER — WITCH HAZEL-GLYCERIN EX PADS
1.0000 | MEDICATED_PAD | CUTANEOUS | Status: DC | PRN
Start: 2017-10-10 — End: 2017-10-12

## 2017-10-10 MED ORDER — SENNOSIDES-DOCUSATE SODIUM 8.6-50 MG PO TABS
2.0000 | ORAL_TABLET | ORAL | Status: DC
  Administered 2017-10-10 – 2017-10-12 (×2): 2 via ORAL
  Filled 2017-10-10 (×2): qty 2

## 2017-10-10 MED ORDER — PHENYLEPHRINE 40 MCG/ML (10ML) SYRINGE FOR IV PUSH (FOR BLOOD PRESSURE SUPPORT)
80.0000 ug | PREFILLED_SYRINGE | INTRAVENOUS | Status: DC | PRN
  Filled 2017-10-10: qty 5

## 2017-10-10 MED ORDER — ALBUTEROL SULFATE (2.5 MG/3ML) 0.083% IN NEBU: 2.5000 mg | INHALATION_SOLUTION | Freq: Four times a day (QID) | RESPIRATORY_TRACT | Status: DC | PRN

## 2017-10-10 MED ORDER — ACETAMINOPHEN 325 MG PO TABS: 650.0000 mg | ORAL_TABLET | ORAL | Status: DC | PRN

## 2017-10-10 MED ORDER — DIBUCAINE 1 % RE OINT
1.0000 | TOPICAL_OINTMENT | RECTAL | Status: DC | PRN
Start: 2017-10-10 — End: 2017-10-12

## 2017-10-10 MED ORDER — LACTATED RINGERS IV SOLN: INTRAVENOUS | Status: DC

## 2017-10-10 MED ORDER — OXYTOCIN 40 UNITS IN LACTATED RINGERS INFUSION - SIMPLE MED: 2.5000 [IU]/h | INTRAVENOUS | Status: DC

## 2017-10-10 MED ORDER — ONDANSETRON HCL 4 MG/2ML IJ SOLN: 4.0000 mg | Freq: Four times a day (QID) | INTRAMUSCULAR | Status: DC | PRN

## 2017-10-10 MED ORDER — FENTANYL 2.5 MCG/ML BUPIVACAINE 1/10 % EPIDURAL INFUSION (WH - ANES)
14.0000 mL/h | INTRAMUSCULAR | Status: DC | PRN
  Administered 2017-10-10: 14 mL/h via EPIDURAL
  Filled 2017-10-10: qty 100

## 2017-10-10 MED ORDER — PRENATAL MULTIVITAMIN CH
1.0000 | ORAL_TABLET | Freq: Every day | ORAL | Status: DC
  Administered 2017-10-11 – 2017-10-12 (×2): 1 via ORAL
  Filled 2017-10-10 (×2): qty 1

## 2017-10-10 MED ORDER — DIPHENHYDRAMINE HCL 25 MG PO CAPS: 25.0000 mg | ORAL_CAPSULE | Freq: Four times a day (QID) | ORAL | Status: DC | PRN

## 2017-10-10 MED ORDER — PHENYLEPHRINE 40 MCG/ML (10ML) SYRINGE FOR IV PUSH (FOR BLOOD PRESSURE SUPPORT)
80.0000 ug | PREFILLED_SYRINGE | INTRAVENOUS | Status: DC | PRN
  Filled 2017-10-10: qty 5
  Filled 2017-10-10: qty 10

## 2017-10-10 MED ORDER — ONDANSETRON HCL 4 MG/2ML IJ SOLN: 4.0000 mg | INTRAMUSCULAR | Status: DC | PRN

## 2017-10-10 MED ORDER — OXYCODONE HCL 5 MG PO TABS: 5.0000 mg | ORAL_TABLET | ORAL | Status: DC | PRN

## 2017-10-10 MED ORDER — OXYCODONE-ACETAMINOPHEN 5-325 MG PO TABS: 2.0000 | ORAL_TABLET | ORAL | Status: DC | PRN

## 2017-10-10 MED ORDER — URSODIOL 300 MG PO CAPS
300.0000 mg | ORAL_CAPSULE | Freq: Two times a day (BID) | ORAL | Status: DC
  Administered 2017-10-10 – 2017-10-12 (×4): 300 mg via ORAL
  Filled 2017-10-10 (×6): qty 1

## 2017-10-10 MED ORDER — SIMETHICONE 80 MG PO CHEW: 80.0000 mg | CHEWABLE_TABLET | ORAL | Status: DC | PRN

## 2017-10-10 MED ORDER — DIPHENHYDRAMINE HCL 50 MG/ML IJ SOLN: 12.5000 mg | INTRAMUSCULAR | Status: DC | PRN

## 2017-10-10 MED ORDER — PANTOPRAZOLE SODIUM 40 MG PO TBEC
40.0000 mg | DELAYED_RELEASE_TABLET | Freq: Every day | ORAL | Status: DC
  Administered 2017-10-11 – 2017-10-12 (×2): 40 mg via ORAL
  Filled 2017-10-10 (×2): qty 1

## 2017-10-10 MED ORDER — LIDOCAINE HCL (PF) 1 % IJ SOLN
INTRAMUSCULAR | Status: DC | PRN
  Administered 2017-10-10 (×2): 4 mL via EPIDURAL

## 2017-10-10 MED ORDER — LACTATED RINGERS IV SOLN
INTRAVENOUS | Status: DC
  Administered 2017-10-10 (×2): via INTRAVENOUS

## 2017-10-10 MED ORDER — LIDOCAINE HCL (PF) 1 % IJ SOLN
30.0000 mL | INTRAMUSCULAR | Status: DC | PRN
  Administered 2017-10-10: 30 mL via SUBCUTANEOUS
  Filled 2017-10-10: qty 30

## 2017-10-10 MED ORDER — OXYCODONE-ACETAMINOPHEN 5-325 MG PO TABS
1.0000 | ORAL_TABLET | ORAL | Status: DC | PRN
  Administered 2017-10-10: 1 via ORAL
  Filled 2017-10-10: qty 1

## 2017-10-10 MED ORDER — SOD CITRATE-CITRIC ACID 500-334 MG/5ML PO SOLN: 30.0000 mL | ORAL | Status: DC | PRN

## 2017-10-10 MED ORDER — COCONUT OIL OIL
1.0000 "application " | TOPICAL_OIL | Status: DC | PRN
  Administered 2017-10-12: 1 via TOPICAL
  Filled 2017-10-10: qty 120

## 2017-10-10 MED ORDER — TERBUTALINE SULFATE 1 MG/ML IJ SOLN
0.2500 mg | Freq: Once | INTRAMUSCULAR | Status: DC | PRN
  Filled 2017-10-10: qty 1

## 2017-10-10 MED ORDER — LACTATED RINGERS IV SOLN
500.0000 mL | Freq: Once | INTRAVENOUS | Status: AC
  Administered 2017-10-10: 500 mL via INTRAVENOUS

## 2017-10-10 MED ORDER — ZOLPIDEM TARTRATE 5 MG PO TABS: 5.0000 mg | ORAL_TABLET | Freq: Every evening | ORAL | Status: DC | PRN

## 2017-10-10 MED ORDER — OXYCODONE HCL 5 MG PO TABS: 10.0000 mg | ORAL_TABLET | ORAL | Status: DC | PRN

## 2017-10-10 MED ORDER — IBUPROFEN 600 MG PO TABS
600.0000 mg | ORAL_TABLET | Freq: Four times a day (QID) | ORAL | Status: DC
  Administered 2017-10-10 – 2017-10-12 (×8): 600 mg via ORAL
  Filled 2017-10-10 (×8): qty 1

## 2017-10-10 MED ORDER — ONDANSETRON HCL 4 MG PO TABS: 4.0000 mg | ORAL_TABLET | ORAL | Status: DC | PRN

## 2017-10-10 MED ORDER — BENZOCAINE-MENTHOL 20-0.5 % EX AERO
1.0000 "application " | INHALATION_SPRAY | CUTANEOUS | Status: DC | PRN
  Filled 2017-10-10: qty 56

## 2017-10-10 NOTE — Anesthesia Pain Management Evaluation Note (Signed)
  CRNA Pain Management Visit Note  Patient: Alyssa [name], 32 y.o., female  "Hello [facility] am a [facility] the [facility]. We have an anesthesia team available at all times to provide care throughout the hospital, including epidural management and anesthesia for C-section. I don't know your plan for the delivery whether it a natural birth, water birth, IV sedation, nitrous supplementation, doula or epidural, but we want to meet your pain goals."   1.Was your pain managed to your expectations on prior hospitalizations?   No prior hospitalizations  2.What is your expectation for pain management during this hospitalization?     Epidural, IV pain meds and Nitrous Oxide  3.How can we help you reach that goal? Be available  Record the patient's initial score and the patient's pain goal.   Pain: 3  Pain Goal: 6 [facility] wants you to be able to say your pain was always managed very well.  [facility] HospitalMERRITT,Jensine Luz 10/10/2017

## 2017-10-10 NOTE — Lactation Note (Signed)
This note was copied from a baby's chart. Lactation Consultation Note  Patient Name: Alyssa [name] ZOXWR'UToday's Date: 10/10/2017 Reason for consult: Initial assessment;Early term 37-38.6wks   P1, Baby 6 hours old.  37 weeks. Baby has had some short feedings.  Noted she is tongue thrusting. Assisted w/ latching but baby latches briefly and pushes off repeatedly with her tongue. Reviewed hand expression and spoon fed approx 5 ml. Had mother post pump for 10 min and mother received 3 more ml which was given to baby via curved tip syringe w/ finger to demonstrated how to feed baby. Discussed LPI feeding plan. Suggest mother post pump 4-6 times per day and give baby back volume pumped or hand expressed w/ next feedings. Discussed cleaning and milk storage. If baby does not latch give her hand expressed or pumped q 3 hours or with feeding cues. Provided with manual pump to prepump and lactation brochure w/ support services.    Maternal Data Has patient been taught Hand Expression?: Yes Does the patient have breastfeeding experience prior to this delivery?: No  Feeding Feeding Type: Breast Fed Length of feed: 5 min  LATCH Score Latch: Repeated attempts needed to sustain latch, nipple held in mouth throughout feeding, stimulation needed to elicit sucking reflex.  Audible Swallowing: A few with stimulation  Type of Nipple: Everted at rest and after stimulation(semi flat)  Comfort (Breast/Nipple): Soft / non-tender  Hold (Positioning): Assistance needed to correctly position infant at breast and maintain latch.  LATCH Score: 7  Interventions Interventions: Breast feeding basics reviewed;Assisted with latch;Skin to skin;Breast massage;Hand express;Pre-pump if needed;Breast compression;Adjust position;Support pillows;Position options;Expressed milk;Hand pump;DEBP  Lactation Tools Discussed/Used Pump Review: Setup, frequency, and cleaning;Milk Storage Initiated by:: Dahlia Byesuth Blen Ransome  RN  Date initiated:: 10/10/17   Consult Status Consult Status: Follow-up Date: 10/11/17 Follow-up type: In-patient    Dahlia ByesBerkelhammer, Ceara Wrightson W[employer] IncBoschen 10/10/2017, 10:35 PM

## 2017-10-10 NOTE — Anesthesia Procedure Notes (Signed)
Epidural Patient location during procedure: OB Start time: 10/10/2017 12:49 PM End time: 10/10/2017 1:14 PM  Staffing Anesthesiologist: Duane Boston, MD Performed: anesthesiologist   Preanesthetic Checklist Completed: patient identified, site marked, pre-op evaluation, timeout performed, IV checked, risks and benefits discussed and monitors and equipment checked  Epidural Patient position: sitting Prep: DuraPrep Patient monitoring: heart rate, cardiac monitor, continuous pulse ox and blood pressure Approach: midline Location: L2-L3 Injection technique: LOR saline  Needle:  Needle type: Tuohy  Needle gauge: 17 G Needle length: 9 cm Needle insertion depth: 7 cm Catheter size: 20 Guage Catheter at skin depth: 12 cm Test dose: negative and Other  Assessment Events: blood not aspirated, injection not painful, no injection resistance and negative IV test  Additional Notes Informed consent obtained prior to proceeding including risk of failure, 1% risk of PDPH, risk of minor discomfort and bruising.  Discussed rare but serious complications including epidural abscess, permanent nerve injury, epidural hematoma.  Discussed alternatives to epidural analgesia and patient desires to proceed.  Timeout performed pre-procedure verifying patient name, procedure, and platelet count.  First catheter was intravascular, so it was removed immediatly. Pt was re-prepped and draped, a second kit was used.  The second catheter was placed with negative aspiration and negative test dose. Patient tolerated procedure well.

## 2017-10-10 NOTE — Progress Notes (Signed)
Patient ID: Alyssa MichaelisAlison Bullins [name], female   DOB: 07/25/1986, 31 y.o.   MRN: [id number]   H&P reviewed, no changes D/W pt IOL and plan  AFVSS gen NAD FHTs 125-140, mod var, + accels, + scalp stim, category 1 toco q 2-3 min  AROM for clear fluid, w/o difficulty or complication  Will start pitocin for IOL Expect SVD - iol due to cholestasis  SVE 2.3/50/-2

## 2017-10-10 NOTE — Anesthesia Preprocedure Evaluation (Signed)
Anesthesia Evaluation  Patient identified by MRN, date of birth, ID band Patient awake    Reviewed: Allergy & Precautions, NPO status , Patient's Chart, lab work & pertinent test results  History of Anesthesia Complications Negative for: history of anesthetic complications  Airway Mallampati: II  TM Distance: >3 FB Neck ROM: Full    Dental no notable dental hx. (+) Dental Advisory Given   Pulmonary neg pulmonary ROS, asthma ,    Pulmonary exam normal        Cardiovascular negative cardio ROS Normal cardiovascular exam     Neuro/Psych negative neurological ROS  negative psych ROS   GI/Hepatic negative GI ROS, Neg liver ROS,   Endo/Other  negative endocrine ROS  Renal/GU negative Renal ROS  negative genitourinary   Musculoskeletal negative musculoskeletal ROS (+)   Abdominal   Peds negative pediatric ROS (+)  Hematology negative hematology ROS (+)   Anesthesia Other Findings   Reproductive/Obstetrics (+) Pregnancy                            Anesthesia Physical Anesthesia Plan  ASA: II  Anesthesia Plan: Epidural   Post-op Pain Management:    Induction:   PONV Risk Score and Plan:   Airway Management Planned: Natural Airway  Additional Equipment:   Intra-op Plan:   Post-operative Plan:   Informed Consent: I have reviewed the patients History and Physical, chart, labs and discussed the procedure including the risks, benefits and alternatives for the proposed anesthesia with the patient or authorized representative who has indicated his/her understanding and acceptance.     Dental advisory given  Plan Discussed with: Anesthesiologist  Anesthesia Plan Comments:         Anesthesia Quick Evaluation  

## 2017-10-10 NOTE — Progress Notes (Signed)
Patient ID: Alyssa MichaelisAlison Bullins [name], female   DOB: 11/12/1985, 31 y.o.   MRN: [id number]   Pt uncomfortable w ctx.  Getting epidrual  AFVSS gen NAD, uncomfortable FHTs 120-130's, mod var, + accels, category 1 toco q 2-4 min  SVE 4/70/-1  Progressing well, expect SVD

## 2017-10-11 LAB — CBC
HCT: 26 % — ABNORMAL LOW (ref 36.0–46.0)
HEMOGLOBIN: 9.1 g/dL — AB (ref 12.0–15.0)
MCH: 29.6 pg (ref 26.0–34.0)
MCHC: 35 g/dL (ref 30.0–36.0)
MCV: 84.7 fL (ref 78.0–100.0)
PLATELETS: 281 10*3/uL (ref 150–400)
RBC: 3.07 MIL/uL — AB (ref 3.87–5.11)
RDW: 14 % (ref 11.5–15.5)
WBC: 16.8 10*3/uL — ABNORMAL HIGH (ref 4.0–10.5)

## 2017-10-11 LAB — RPR: RPR: NONREACTIVE

## 2017-10-11 NOTE — Plan of Care (Signed)
Patient progressing appropriately.

## 2017-10-11 NOTE — Anesthesia Postprocedure Evaluation (Signed)
Anesthesia Post Note  Patient: Jake MichaelisAlison Bullins Joss  Procedure(s) Performed: AN AD HOC LABOR EPIDURAL     Patient location during evaluation: Mother Baby Anesthesia Type: Epidural Level of consciousness: awake and alert and oriented Pain management: satisfactory to patient Vital Signs Assessment: post-procedure vital signs reviewed and stable Respiratory status: spontaneous breathing and nonlabored ventilation Cardiovascular status: stable Postop Assessment: no headache, no backache, no signs of nausea or vomiting, adequate PO intake and patient able to bend at knees (patient up walking) Anesthetic complications: no    Last Vitals:  Vitals:   10/10/17 2330 10/11/17 0611  BP: 132/79 120/77  Pulse: 92 93  Resp: 18 18  Temp: 36.9 C 36.9 C  SpO2:      Last Pain:  Vitals:   10/11/17 0611  TempSrc: Oral  PainSc:    Pain Goal: Patients Stated Pain Goal: 4 (10/10/17 1147)               Madison HickmanGREGORY,Lalitha Ilyas

## 2017-10-11 NOTE — Lactation Note (Signed)
This note was copied from a baby's chart. Lactation Consultation Note  Patient Name: Alyssa [name] Reason for consult: Follow-up assessment;Difficult latch;Early term 7437-38.6wks  Visited with P1 Mom of 3924w0d baby at 6 lbs 4.7 oz. Baby 23 hrs old.  Baby at 3% weight loss today. Mom has been trying to latch baby onto breast, with and without a nipple shield which was introduced last night to help with latch.  Baby has a semi-high arched palate, and tends to tongue thrust.  Demonstrated suck training on a finger. Baby positioned STS in football hold on left side.  Mom needing some guidance on where to place her hand to sandwich breast.   Breasts are small, and slightly wide spaced.  Mom does report + breast changes in early pregnancy.  Baby unable to attain a deep areolar latch.  Baby did suck for a couple minutes, Mom stated she felt pinching.  Nipple pinched slightly when baby removed from breast.   Labial frenulum noted, high palate, unable to see lingual frenulum, baby can extend tongue, and elevate it midway in mouth.  Initiated a 20 mm nipple shield while instructions on how to properly place.  Baby needed a little help latching, but able to open widely, but baby pushing nipple out of mouth.  Taught Mom and FOB how to firmly hold breast, and use alternate breast compression during feeding.  Hand expressed colostrum into shield to entice baby.  After baby wouldn't continue actively suck/swallow, talked to parents about need to supplement temporarily.  Mom and FOB were happy as they have been worried.  Initiated Similac formula 12 ml in 5 fr feeding tube at breast under nipple shield.  Baby slow to start, but eventually became more actively sucking and swallowing.  Instructed Mom to hand express colostrum ac and pc if able to.  Recommended double pumping on initiation setting to help stimulate and support her milk supply. Mom to awaken baby at 3 hrs and place her  STS to stimulate her to awaken to feed.   Mom to call for assistance as needed.   Feeding Feeding Type: Breast Fed Length of feed: 20 min  LATCH Score Latch: Grasps breast easily, tongue down, lips flanged, rhythmical sucking.(sleepy even with supplement)  Audible Swallowing: A few with stimulation  Type of Nipple: Everted at rest and after stimulation  Comfort (Breast/Nipple): Soft / non-tender  Hold (Positioning): Assistance needed to correctly position infant at breast and maintain latch.  LATCH Score: 8  Interventions Interventions: Breast feeding basics reviewed;Assisted with latch;Skin to skin;Breast massage;Hand express;Position options;Expressed milk;Breast compression;Adjust position;Support pillows;DEBP;Pre-pump if needed  Lactation Tools Discussed/Used Tools: Pump;Nipple Shields;27F feeding tube / Syringe Nipple shield size: 20 Breast pump type: Double-Electric Breast Pump WIC Program: No   Consult Status Consult Status: Follow-up Date: 10/12/17 Follow-up type: In-patient    Judee ClaraSmith, [name], 3:53 PM

## 2017-10-11 NOTE — Progress Notes (Signed)
PPD #1 No problems Afeb, VSS Fundus firm, NT at U-1 Continue routine postpartum care 

## 2017-10-12 MED ORDER — IBUPROFEN 600 MG PO TABS: 600.0000 mg | ORAL_TABLET | Freq: Four times a day (QID) | ORAL | 1 refills | Status: DC | PRN

## 2017-10-12 NOTE — Lactation Note (Signed)
This note was copied from a baby's chart. Lactation Consultation Note Mom had concerns that her "milk" wasn't in and that she wasn't feeling "let down". Discussed w/mom mature milk comes in 3-5 days, mom stated she wasn't getting thing when she pumped, discussed consistency of colostrum, discussed hand expression, hand massage, pumping for stimulation.  Mom stated she didn't know if the baby was getting anything. Encouraged to feel breast before and after BF to assess breast for transfer. Asked mom if see anything in NS, mom stated a little. Asked mom if using NS and supplementing w/5Fr tube, mom stated yes. Asked moms concern? Baby is getting something either from you or the supplement.  Praised mom for all that she is doing and encouraged to relax and rest while baby is relaxing.  Reported to RN.  Patient Name: Alyssa [name] ZOXWR'UToday's Date: 10/12/2017 Reason for consult: Mother's request;Early term 37-38.6wks   Maternal Data    Feeding Feeding Type: Breast Fed Length of feed: 15 min  LATCH Score Latch: Repeated attempts needed to sustain latch, nipple held in mouth throughout feeding, stimulation needed to elicit sucking reflex.  Audible Swallowing: A few with stimulation  Type of Nipple: Everted at rest and after stimulation  Comfort (Breast/Nipple): Soft / non-tender  Hold (Positioning): Assistance needed to correctly position infant at breast and maintain latch.  LATCH Score: 7  Interventions    Lactation Tools Discussed/Used     Consult Status Consult Status: Follow-up Date: 10/12/17 Follow-up type: In-patient    Ashonte Angelucci, Diamond NickelLAURA G 10/12/2017, 3:02 AM

## 2017-10-12 NOTE — Lactation Note (Signed)
This note was copied from a baby's chart. Lactation Consultation Note  Patient Name: Girl Melvern Bankerlison Donath ZOXWR'UToday's Date: 10/12/2017   Baby 42 hours old.  37 weeks. Family is breastfeeding using NS and 5 french feeding tube. She is post pumping and also supplementing w/ formula. Discussed [employer] gifts for rental pump until their pump arrives. Encouraged post pump 4-6 times per day and giving volume back to baby. Offered OP appt. Family states they will call if needed. Discussed fenugreek. Reviewed engorgement care and monitoring voids/stools. Mom encouraged to feed baby 8-12 times/24 hours and with feeding cues at least q 3 hours.       Maternal Data    Feeding Feeding Type: Breast Fed  LATCH Score                   Interventions    Lactation Tools Discussed/Used     Consult Status      Hardie PulleyBerkelhammer, Madalynne Gutmann Boschen 10/12/2017, 10:24 AM

## 2017-10-12 NOTE — Progress Notes (Signed)
Patient ID: Jake MichaelisAlison Bullins Thiam, female   DOB: 07/01/1986, 31 y.o.   MRN: [id number] Pt reports sore but otherwise doing well. Still frustrated with milk production; using nipple sheld and syringe and has good amount noted. No fever or chills or CP or SOB. Has some vaginal soreness but got relief with ice packs. Lochia mild Bonding well with baby VSS ABD - FF and below umbilicus EXT +2 edema b/l  A/P: PPD#2 s/p svd - stable         Reassured regarding milk production - baby weight appropriate and no holds for discharge          Counseled on keeping LE elevated           Reviewed discharge instructions

## 2017-10-12 NOTE — Progress Notes (Signed)
Throughout the night, the RN has been educating mom and dad on breastfeeding.  Mom and dad have both been vocal throughout the night shift that they feel like mom is not producing any milk.  The RN has been encouraging, explaining that mom's milk will come in 3-5 days and educating them about how the mom's milk will come in sooner with the stimulation (breast massage, latching baby, using the DEBP).  The RN had the lactation consultant see them as well and reiterated everything the RN educated the parents on.  Mom was a little emotional around 0400 when the RN went in to give them a bottle.  Dad came out and explained how upset mom was. RN reiterated the teaching and reassured him that they were doing everything they needed to for baby. The RN also encouraged mom and dad to try and get some rest since they have not slept much since baby was born. When RN took mom's vital signs, mom seemed to be in a better state and had slept some.

## 2017-10-12 NOTE — Progress Notes (Signed)
CSW received consult due to score 13 on Edinburgh Depression Screen.  When CSW arrived, MOB and FOB were packing and preparing or discharge (infant was asleep in the bassinet). CSW explained CSW's role and MOB gave CSW permission to meet with MOB while FOB was present. Both parents were polite, easy to engage, and receptive to meeting with CSW.   CSW reviewed the EDPS and MOB's response.  MOB shared that overall MOB feels well and was not concerned at this time about her MH.  MOB denied a MH hx and reported a wealth of support from MOB's and FOB's family. CSW provided education regarding Baby Blues vs PMADs and provided. CSW encouraged MOB to evaluate her mental health throughout the postpartum period with the use of the New Mom Checklist developed by [facility]partum [facility] and notify a medical professional if symptoms arise.  MOB did not present with any acute signs and symptoms and MOB appeared to have insight and awareness. CSW assessed for safety and MOB denied SI and HI.   There are no barriers to d/c.  Alyssa [name], MSW, LCSW Clinical Social Work [phone] 

## 2017-10-12 NOTE — Discharge Summary (Signed)
OB Discharge Summary     Patient Name: Alyssa [name] DOB: 02/09/1986 MRN: [id number]  Date of admission: 10/10/2017 Delivering MD: Sherian ReinBOVARD-STUCKERT, JODY   Date of discharge: 10/12/2017  Admitting diagnosis: INDCUTION Intrauterine pregnancy: 8771w0d     Secondary diagnosis:  Principal Problem:   SVD (spontaneous vaginal delivery) Active Problems:   Cholestasis during pregnancy, antepartum  Additional problems: none     Discharge diagnosis: Term Pregnancy Delivered                                                                                                Post partum procedures:none  Augmentation: AROM and Pitocin  Complications: None  Hospital course:  Induction of Labor With Vaginal Delivery   32 y.o. yo G1P1001 at 2071w0d was admitted to the hospital 10/10/2017 for induction of labor.  Indication for induction: Cholestasis of pregnancy.  Patient had an uncomplicated labor course as follows: Membrane Rupture Time/Date: 8:47 AM ,10/10/2017   Intrapartum Procedures: Episiotomy: None [1]                                         Lacerations:  2nd degree [3]  Patient had delivery of a Viable infant.  Information for the patient's newborn:  Rae LipsShuler, Girl Jill Sidelison [[id number]][[id number]]  Delivery Method: Vag-Spont   10/10/2017  Details of delivery can be found in separate delivery note.  Patient had a routine postpartum course. Patient is discharged home 10/12/17.  Physical exam  Vitals:   10/10/17 1930 10/10/17 2330 10/11/17 0611 10/11/17 1854  BP:   120/77 123/71  Pulse: (!) 107 92 93 90  Resp: 18 18 18 17   Temp: 98.4 F (36.9 C) 98.5 F (36.9 C) 98.4 F (36.9 C) 98 F (36.7 C)  TempSrc: Axillary Axillary Oral Oral  SpO2:    98%  Weight:      Height:       General: alert, cooperative and no distress Lochia: appropriate Uterine Fundus: firm Incision: N/A DVT Evaluation: No evidence of DVT seen on physical exam. Labs: Lab Results  Component Value Date   WBC 16.8 (H)  10/11/2017   HGB 9.1 (L) 10/11/2017   HCT 26.0 (L) 10/11/2017   MCV 84.7 10/11/2017   PLT 281 10/11/2017   CMP Latest Ref Rng & Units 07/27/2014  Glucose 70 - 99 mg/dL 94  BUN 6 - 23 mg/dL 12  Creatinine 7.820.50 - 9.561.10 mg/dL 2.130.83  Sodium 086137 - 578147 mEq/L 138  Potassium 3.7 - 5.3 mEq/L 3.9  Chloride 96 - 112 mEq/L 103  CO2 19 - 32 mEq/L 21  Calcium 8.4 - 10.5 mg/dL 9.1  Total Protein 6.0 - 8.3 g/dL 7.5  Total Bilirubin 0.3 - 1.2 mg/dL 0.3  Alkaline Phos 39 - 117 U/L 60  AST 0 - 37 U/L 16  ALT 0 - 35 U/L 17    Discharge instruction: per After Visit Summary and "Baby and Me Booklet".  After visit meds:  Allergies as of 10/12/2017  Reactions   Sulfur Rash      Medication List    TAKE these medications   acetaminophen 500 MG tablet Commonly known as:  TYLENOL Take 500 mg by mouth every 6 (six) hours as needed for moderate pain or headache.   albuterol 108 (90 Base) MCG/ACT inhaler Commonly known as:  PROVENTIL HFA;VENTOLIN HFA Inhale 2 puffs into the lungs every 6 (six) hours as needed for wheezing or shortness of breath.   amoxicillin 400 MG/5ML suspension Commonly known as:  AMOXIL Take 12.5 mLs (1,000 mg total) by mouth 2 (two) times daily.   ibuprofen 600 MG tablet Commonly known as:  ADVIL,MOTRIN Take 1 tablet (600 mg total) by mouth every 6 (six) hours as needed.   pantoprazole 40 MG tablet Commonly known as:  PROTONIX pantoprazole 40 mg tablet,delayed release  1 po qd   URSO 250 MG tablet Generic drug:  ursodiol Take 250 mg by mouth 2 (two) times daily.       Diet: routine diet  Activity: Advance as tolerated. Pelvic rest for 6 weeks.   Outpatient follow up:6 weeks Follow up Appt:No future appointments. Follow up Visit:No Follow-up on file.  Postpartum contraception: Not Discussed  Newborn Data: Live born female  Birth Weight: 6 lb 7.9 oz (2945 g) APGAR: 8, 9  Newborn Delivery   Birth date/time:  10/10/2017 15:56:00 Delivery type:  Vaginal,  Spontaneous     Baby Feeding: Breast Disposition:home with mother   10/12/2017 Cathrine Muster, DO

## 2017-10-12 NOTE — Discharge Instructions (Signed)
Nothing in vagina for 6 weeks.  No sex, tampons, and douching.  Other instructions as in [facility] Discharge Booklet. °

## 2017-10-31 ENCOUNTER — Inpatient Hospital Stay (HOSPITAL_COMMUNITY)
Admission: AD | Admit: 2017-10-31 | Payer: Managed Care, Other (non HMO) | Source: Ambulatory Visit | Admitting: Obstetrics and Gynecology

## 2021-07-12 ENCOUNTER — Ambulatory Visit (INDEPENDENT_AMBULATORY_CARE_PROVIDER_SITE_OTHER): Payer: 59 | Admitting: Family Medicine

## 2021-07-12 ENCOUNTER — Encounter: Payer: Self-pay | Admitting: Family Medicine

## 2021-07-12 ENCOUNTER — Other Ambulatory Visit: Payer: Self-pay

## 2021-07-12 VITALS — BP 124/85 | HR 83 | Temp 97.7°F | Ht 64.0 in | Wt 193.0 lb

## 2021-07-12 DIAGNOSIS — E559 Vitamin D deficiency, unspecified: Secondary | ICD-10-CM | POA: Diagnosis not present

## 2021-07-12 DIAGNOSIS — R112 Nausea with vomiting, unspecified: Secondary | ICD-10-CM

## 2021-07-12 DIAGNOSIS — R1011 Right upper quadrant pain: Secondary | ICD-10-CM

## 2021-07-12 DIAGNOSIS — K219 Gastro-esophageal reflux disease without esophagitis: Secondary | ICD-10-CM

## 2021-07-12 DIAGNOSIS — J45909 Unspecified asthma, uncomplicated: Secondary | ICD-10-CM | POA: Insufficient documentation

## 2021-07-12 MED ORDER — OMEPRAZOLE 20 MG PO CPDR: 20.0000 mg | DELAYED_RELEASE_CAPSULE | Freq: Every day | ORAL | 3 refills | Status: DC

## 2021-07-12 NOTE — Progress Notes (Signed)
Subjective:  [name], female    DOB: Nov 24, 1985, 35 y.o.   MRN: [id number]  Patient Care Team: Baruch Gouty, FNP as PCP - General (Family Medicine)   Chief Complaint:  New Patient (Initial Visit) (Gall Bladder, bad indigestion, heartburn//Last 3 weeks- nausea vomitting, abdominal pain)   HPI: Alyssa [name] is a 35 y.o. female presenting on 07/12/2021 for New Patient (Initial Visit) (Gall Bladder, bad indigestion, heartburn//Last 3 weeks- nausea vomitting, abdominal pain)  Pt presents today to establish care with new PCP. She has been followed by her GYN on a yearly basis but has not seen a PCP in over 3 years. She states her main concern today is RUQ abdominal pain, daily heart burn, and nausea and vomiting. She states she had gallstones during pregnancy but did not have her GB removed. She states over the last 3-4 weeks she has had daily heart burn which is relieved by Tums. She states she also has RUQ pain with nausea and vomiting. No fever, chills, weakness, urinary symptoms, or confusion. No diarrhea or constipation.   Abdominal Pain This is a recurrent problem. The current episode started 1 to 4 weeks ago. The problem occurs intermittently. The problem has been waxing and waning. The pain is located in the RUQ, epigastric region and RLQ. The pain is at a severity of 4/10. The pain is mild. The quality of the pain is cramping, aching and sharp. The abdominal pain radiates to the epigastric region and periumbilical region. Associated symptoms include nausea and vomiting. Pertinent negatives include no anorexia, arthralgias, belching, constipation, diarrhea, dysuria, fever, flatus, frequency, headaches, hematochezia, hematuria, melena, myalgias or weight loss. The pain is aggravated by eating. The pain is relieved by Nothing. She has tried antacids for the symptoms. The treatment provided mild relief.    Relevant past medical, surgical, family, and social  history reviewed and updated as indicated.  Allergies and medications reviewed and updated. Data reviewed: Chart in Epic.   Past Medical History:  Diagnosis Date   Acne    Asthma    Cholestasis    SVD (spontaneous vaginal delivery) 10/10/2017    Past Surgical History:  Procedure Laterality Date   WISDOM TOOTH EXTRACTION      Social History   Socioeconomic History   Marital status: Married    Spouse name: Not on file   Number of children: Not on file   Years of education: Not on file   Highest education level: Not on file  Occupational History   Not on file  Tobacco Use   Smoking status: Never   Smokeless tobacco: Never  Substance and Sexual Activity   Alcohol use: No   Drug use: No   Sexual activity: Not on file  Other Topics Concern   Not on file  Social History Narrative   Not on file   Social Determinants of Health   Financial Resource Strain: Not on file  Food Insecurity: Not on file  Transportation Needs: Not on file  Physical Activity: Not on file  Stress: Not on file  Social Connections: Not on file  Intimate Partner Violence: Not on file    Outpatient Encounter Medications as of 07/12/2021  Medication Sig   acetaminophen (TYLENOL) 500 MG tablet Take 500 mg by mouth every 6 (six) hours as needed for moderate pain or headache.   Multiple Vitamin (MULTI-VITAMIN DAILY PO) Multi Vitamin   omeprazole (PRILOSEC) 20 MG capsule Take 1 capsule (20 mg total)  by mouth at bedtime.   [DISCONTINUED] albuterol (PROVENTIL HFA;VENTOLIN HFA) 108 (90 Base) MCG/ACT inhaler Inhale 2 puffs into the lungs every 6 (six) hours as needed for wheezing or shortness of breath.   [DISCONTINUED] amoxicillin (AMOXIL) 400 MG/5ML suspension Take 12.5 mLs (1,000 mg total) by mouth 2 (two) times daily. (Patient not taking: Reported on 10/10/2017)   [DISCONTINUED] ibuprofen (ADVIL,MOTRIN) 600 MG tablet Take 1 tablet (600 mg total) by mouth every 6 (six) hours as needed.   [DISCONTINUED]  pantoprazole (PROTONIX) 40 MG tablet pantoprazole 40 mg tablet,delayed release  1 po qd   [DISCONTINUED] ursodiol (URSO) 250 MG tablet Take 250 mg by mouth 2 (two) times daily.   No facility-administered encounter medications on file as of 07/12/2021.    Allergies  Allergen Reactions   Elemental Sulfur Rash    Review of Systems  Constitutional:  Positive for appetite change. Negative for activity change, chills, diaphoresis, fatigue, fever, unexpected weight change and weight loss.  HENT: Negative.    Eyes: Negative.   Respiratory:  Negative for cough, chest tightness and shortness of breath.   Cardiovascular:  Negative for chest pain, palpitations and leg swelling.  Gastrointestinal:  Positive for abdominal pain, nausea and vomiting. Negative for abdominal distention, anal bleeding, anorexia, blood in stool, constipation, diarrhea, flatus, hematochezia, melena and rectal pain.  Endocrine: Negative.   Genitourinary:  Negative for decreased urine volume, difficulty urinating, dysuria, frequency, hematuria, menstrual problem, urgency, vaginal bleeding, vaginal discharge and vaginal pain.  Musculoskeletal:  Negative for arthralgias and myalgias.  Skin: Negative.   Allergic/Immunologic: Negative.   Neurological:  Negative for dizziness, tremors, seizures, syncope, facial asymmetry, speech difficulty, weakness, light-headedness, numbness and headaches.  Hematological: Negative.   Psychiatric/Behavioral:  Negative for confusion, hallucinations, sleep disturbance and suicidal ideas.   All other systems reviewed and are negative.      Objective:  BP 124/85   Pulse 83   Temp 97.7 F (36.5 C)   Ht 5' 4"  (1.626 m)   Wt 193 lb (87.5 kg)   LMP 07/04/2021   SpO2 98%   BMI 33.13 kg/m    Wt Readings from Last 3 Encounters:  07/12/21 193 lb (87.5 kg)  10/10/17 208 lb (94.3 kg)  07/02/17 179 lb 12.8 oz (81.6 kg)    Physical Exam Vitals and nursing note reviewed.  Constitutional:       General: She is not in acute distress.    Appearance: Normal appearance. She is well-developed and well-groomed. She is obese. She is not ill-appearing, toxic-appearing or diaphoretic.  HENT:     Head: Normocephalic and atraumatic.     Jaw: There is normal jaw occlusion.     Right Ear: Hearing normal.     Left Ear: Hearing normal.     Nose: Nose normal.     Mouth/Throat:     Lips: Pink.     Mouth: Mucous membranes are moist.     Pharynx: Oropharynx is clear. Uvula midline.  Eyes:     General: Lids are normal.     Extraocular Movements: Extraocular movements intact.     Conjunctiva/sclera: Conjunctivae normal.     Pupils: Pupils are equal, round, and reactive to light.  Neck:     Thyroid: No thyroid mass, thyromegaly or thyroid tenderness.     Vascular: No carotid bruit or JVD.     Trachea: Trachea and phonation normal.  Cardiovascular:     Rate and Rhythm: Normal rate and regular rhythm.     Chest  Wall: PMI is not displaced.     Pulses: Normal pulses.     Heart sounds: Normal heart sounds. No murmur heard.   No friction rub. No gallop.  Pulmonary:     Effort: Pulmonary effort is normal. No respiratory distress.     Breath sounds: Normal breath sounds. No wheezing.  Abdominal:     General: Abdomen is protuberant. Bowel sounds are normal. There is no distension or abdominal bruit.     Palpations: Abdomen is soft. There is no hepatomegaly or splenomegaly.     Tenderness: There is abdominal tenderness in the right upper quadrant. There is no right CVA tenderness, left CVA tenderness, guarding or rebound. Negative signs include Murphy's sign and McBurney's sign.     Hernia: No hernia is present.  Musculoskeletal:        General: Normal range of motion.     Cervical back: Normal range of motion and neck supple.     Right lower leg: No edema.     Left lower leg: No edema.  Lymphadenopathy:     Cervical: No cervical adenopathy.  Skin:    General: Skin is warm and dry.      Capillary Refill: Capillary refill takes less than 2 seconds.     Coloration: Skin is not cyanotic, jaundiced or pale.     Findings: No rash.  Neurological:     General: No focal deficit present.     Mental Status: She is alert and oriented to person, place, and time.     Cranial Nerves: Cranial nerves are intact. No cranial nerve deficit.     Sensory: Sensation is intact. No sensory deficit.     Motor: Motor function is intact. No weakness.     Coordination: Coordination is intact. Coordination normal.     Gait: Gait is intact. Gait normal.     Deep Tendon Reflexes: Reflexes are normal and symmetric. Reflexes normal.  Psychiatric:        Attention and Perception: Attention and perception normal.        Mood and Affect: Mood and affect normal.        Speech: Speech normal.        Behavior: Behavior normal. Behavior is cooperative.        Thought Content: Thought content normal.        Cognition and Memory: Cognition and memory normal.        Judgment: Judgment normal.    Results for orders placed or performed during the hospital encounter of 10/10/17  OB RESULT CONSOLE Group B Strep  Result Value Ref Range   GBS Negative   CBC  Result Value Ref Range   WBC 14.1 (H) 4.0 - 10.5 K/uL   RBC 3.84 (L) 3.87 - 5.11 MIL/uL   Hemoglobin 10.7 (L) 12.0 - 15.0 g/dL   HCT 32.3 (L) 36.0 - 46.0 %   MCV 84.1 78.0 - 100.0 fL   MCH 27.9 26.0 - 34.0 pg   MCHC 33.1 30.0 - 36.0 g/dL   RDW 13.9 11.5 - 15.5 %   Platelets 338 150 - 400 K/uL  RPR  Result Value Ref Range   RPR Ser Ql Non Reactive Non Reactive  CBC  Result Value Ref Range   WBC 16.8 (H) 4.0 - 10.5 K/uL   RBC 3.07 (L) 3.87 - 5.11 MIL/uL   Hemoglobin 9.1 (L) 12.0 - 15.0 g/dL   HCT 26.0 (L) 36.0 - 46.0 %   MCV 84.7 78.0 - 100.0  fL   MCH 29.6 26.0 - 34.0 pg   MCHC 35.0 30.0 - 36.0 g/dL   RDW 14.0 11.5 - 15.5 %   Platelets 281 150 - 400 K/uL  Type and screen [place]  Result Value Ref Range   ABO/RH(D) B POS     Antibody Screen NEG    Sample Expiration 10/13/2017   ABO/Rh  Result Value Ref Range   ABO/RH(D) B POS        Pertinent labs & imaging results that were available during my care of the patient were reviewed by me and considered in my medical decision making.  Assessment & Plan:  Alyssa [name] was seen today for new patient (initial visit).  Diagnoses and all orders for this visit:  Right upper quadrant abdominal pain Nausea and vomiting in adult Concerning for cholelithiasis, no indications of acute cholecystitis. No jaundice or distress noted. Will check below labs and obtain RUQ [place]. Symptomatic care discussed in detail. Report any new or worsening symptoms.  -     CMP14+EGFR -     CBC with Differential/Platelet -     Amylase -     Lipase -     US Abdomen Limited RUQ (LIVER/GB); Future  Gastroesophageal reflux disease without esophagitis Will start omeprazole therapy. No red flags present. Pt aware to report any new, worsening, or persistent symptoms. Follow up in 6-8 weeks for reevaluation.  -     CBC with Differential/Platelet -     omeprazole (PRILOSEC) 20 MG capsule; Take 1 capsule (20 mg total) by mouth at bedtime.  Vitamin D deficiency Will check levels today and adjust repletion therapy if warranted.  -     VITAMIN D 25 Hydroxy (Vit-D Deficiency, Fractures)    Continue all other maintenance medications.  Follow up plan: Return in about 8 weeks (around 09/06/2021), or if symptoms worsen or fail to improve, for GERD.   Continue healthy lifestyle choices, including diet (rich in fruits, vegetables, and lean proteins, and low in salt and simple carbohydrates) and exercise (at least 30 minutes of moderate physical activity daily).  Educational handout given for cholelithiasis  The above assessment and management plan was discussed with the patient. The patient verbalized understanding of and has agreed to the management plan. Patient is aware to call the clinic if they  develop any new symptoms or if symptoms persist or worsen. Patient is aware when to return to the clinic for a follow-up visit. Patient educated on when it is appropriate to go to the emergency department.   Monia Pouch, FNP-C [facility] [phone]

## 2021-07-13 LAB — CMP14+EGFR
ALT: 10 IU/L (ref 0–32)
AST: 13 IU/L (ref 0–40)
Albumin/Globulin Ratio: 1.9 (ref 1.2–2.2)
Albumin: 4.8 g/dL (ref 3.8–4.8)
Alkaline Phosphatase: 76 IU/L (ref 44–121)
BUN/Creatinine Ratio: 17 (ref 9–23)
BUN: 13 mg/dL (ref 6–20)
Bilirubin Total: 0.4 mg/dL (ref 0.0–1.2)
CO2: 23 mmol/L (ref 20–29)
Calcium: 10.6 mg/dL — ABNORMAL HIGH (ref 8.7–10.2)
Chloride: 100 mmol/L (ref 96–106)
Creatinine, Ser: 0.76 mg/dL (ref 0.57–1.00)
Globulin, Total: 2.5 g/dL (ref 1.5–4.5)
Glucose: 87 mg/dL (ref 70–99)
Potassium: 4.9 mmol/L (ref 3.5–5.2)
Sodium: 140 mmol/L (ref 134–144)
Total Protein: 7.3 g/dL (ref 6.0–8.5)
eGFR: 105 mL/min/{1.73_m2} (ref >59)

## 2021-07-13 LAB — CBC WITH DIFFERENTIAL/PLATELET
Basophils Absolute: 0 10*3/uL (ref 0.0–0.2)
Basos: 1 %
EOS (ABSOLUTE): 0.1 10*3/uL (ref 0.0–0.4)
Eos: 1 %
Hematocrit: 42.2 % (ref 34.0–46.6)
Hemoglobin: 13.4 g/dL (ref 11.1–15.9)
Immature Grans (Abs): 0 10*3/uL (ref 0.0–0.1)
Immature Granulocytes: 0 %
Lymphocytes Absolute: 3 10*3/uL (ref 0.7–3.1)
Lymphs: 39 %
MCH: 27.5 pg (ref 26.6–33.0)
MCHC: 31.8 g/dL (ref 31.5–35.7)
MCV: 87 fL (ref 79–97)
Monocytes Absolute: 0.4 10*3/uL (ref 0.1–0.9)
Monocytes: 5 %
Neutrophils Absolute: 4.1 10*3/uL (ref 1.4–7.0)
Neutrophils: 54 %
Platelets: 379 10*3/uL (ref 150–450)
RBC: 4.88 x10E6/uL (ref 3.77–5.28)
RDW: 13.1 % (ref 11.7–15.4)
WBC: 7.6 10*3/uL (ref 3.4–10.8)

## 2021-07-13 LAB — VITAMIN D 25 HYDROXY (VIT D DEFICIENCY, FRACTURES): Vit D, 25-Hydroxy: 34.7 ng/mL (ref 30.0–100.0)

## 2021-07-13 LAB — LIPASE: Lipase: 26 U/L (ref 14–72)

## 2021-07-13 LAB — AMYLASE: Amylase: 45 U/L (ref 31–110)

## 2021-07-13 NOTE — Addendum Note (Signed)
Addended by: Angela Nevin D on: 07/13/2021 05:19 PM   Modules accepted: Orders

## 2021-07-14 ENCOUNTER — Other Ambulatory Visit: Payer: 59

## 2021-07-14 ENCOUNTER — Other Ambulatory Visit: Payer: Self-pay

## 2021-07-14 DIAGNOSIS — R1011 Right upper quadrant pain: Secondary | ICD-10-CM

## 2021-07-14 NOTE — Addendum Note (Signed)
Addended by: Angela Nevin D on: 07/14/2021 09:34 AM   Modules accepted: Orders

## 2021-07-15 LAB — ANEMIA PROFILE B
Basophils Absolute: 0 10*3/uL (ref 0.0–0.2)
Basos: 0 %
EOS (ABSOLUTE): 0.1 10*3/uL (ref 0.0–0.4)
Eos: 1 %
Ferritin: 60 ng/mL (ref 15–150)
Folate: 14.8 ng/mL (ref >3.0)
Hematocrit: 40.1 % (ref 34.0–46.6)
Hemoglobin: 13 g/dL (ref 11.1–15.9)
Immature Grans (Abs): 0 10*3/uL (ref 0.0–0.1)
Immature Granulocytes: 0 %
Iron Saturation: 27 % (ref 15–55)
Iron: 81 ug/dL (ref 27–159)
Lymphocytes Absolute: 2.7 10*3/uL (ref 0.7–3.1)
Lymphs: 37 %
MCH: 27.9 pg (ref 26.6–33.0)
MCHC: 32.4 g/dL (ref 31.5–35.7)
MCV: 86 fL (ref 79–97)
Monocytes Absolute: 0.4 10*3/uL (ref 0.1–0.9)
Monocytes: 6 %
Neutrophils Absolute: 4.1 10*3/uL (ref 1.4–7.0)
Neutrophils: 56 %
Platelets: 335 10*3/uL (ref 150–450)
RBC: 4.66 x10E6/uL (ref 3.77–5.28)
RDW: 13.2 % (ref 11.7–15.4)
Retic Ct Pct: 1.1 % (ref 0.6–2.6)
Total Iron Binding Capacity: 299 ug/dL (ref 250–450)
UIBC: 218 ug/dL (ref 131–425)
Vitamin B-12: 478 pg/mL (ref 232–1245)
WBC: 7.4 10*3/uL (ref 3.4–10.8)

## 2021-07-15 LAB — CMP14+EGFR
ALT: 14 IU/L (ref 0–32)
AST: 18 IU/L (ref 0–40)
Albumin/Globulin Ratio: 1.9 (ref 1.2–2.2)
Albumin: 4.4 g/dL (ref 3.8–4.8)
Alkaline Phosphatase: 68 IU/L (ref 44–121)
BUN/Creatinine Ratio: 16 (ref 9–23)
BUN: 12 mg/dL (ref 6–20)
Bilirubin Total: 0.5 mg/dL (ref 0.0–1.2)
CO2: 23 mmol/L (ref 20–29)
Calcium: 9.5 mg/dL (ref 8.7–10.2)
Chloride: 101 mmol/L (ref 96–106)
Creatinine, Ser: 0.76 mg/dL (ref 0.57–1.00)
Globulin, Total: 2.3 g/dL (ref 1.5–4.5)
Glucose: 83 mg/dL (ref 70–99)
Potassium: 4.3 mmol/L (ref 3.5–5.2)
Sodium: 139 mmol/L (ref 134–144)
Total Protein: 6.7 g/dL (ref 6.0–8.5)
eGFR: 105 mL/min/{1.73_m2} (ref >59)

## 2021-07-18 ENCOUNTER — Ambulatory Visit (HOSPITAL_COMMUNITY)
Admission: RE | Admit: 2021-07-18 | Discharge: 2021-07-18 | Disposition: A | Discharge status: Home / Self Care | Payer: 59 | Source: Ambulatory Visit | Attending: Family Medicine | Admitting: Family Medicine

## 2021-07-18 ENCOUNTER — Other Ambulatory Visit: Payer: Self-pay

## 2021-07-18 DIAGNOSIS — R1011 Right upper quadrant pain: Secondary | ICD-10-CM | POA: Diagnosis present

## 2021-07-18 DIAGNOSIS — R112 Nausea with vomiting, unspecified: Secondary | ICD-10-CM | POA: Insufficient documentation

## 2021-08-31 DIAGNOSIS — N83209 Unspecified ovarian cyst, unspecified side: Secondary | ICD-10-CM | POA: Insufficient documentation

## 2021-09-02 ENCOUNTER — Telehealth: Payer: Self-pay | Admitting: Family Medicine

## 2021-09-02 ENCOUNTER — Other Ambulatory Visit: Payer: Self-pay | Admitting: Family Medicine

## 2021-09-02 DIAGNOSIS — R062 Wheezing: Secondary | ICD-10-CM

## 2021-09-02 MED ORDER — ALBUTEROL SULFATE HFA 108 (90 BASE) MCG/ACT IN AERS: 2.0000 | INHALATION_SPRAY | Freq: Four times a day (QID) | RESPIRATORY_TRACT | 2 refills | Status: AC | PRN

## 2021-09-02 NOTE — Telephone Encounter (Signed)
  Prescription Request  09/02/2021  Is this a "Controlled Substance" medicine? no  Have you seen your PCP in the last 2 weeks? Had appt in Oct and has appt next week  If YES, route message to pool  -  If NO, patient needs to be scheduled for appointment.  What is the name of the medication or equipment? Pt wants inhaler called in. [facility] [place]  Have you contacted your pharmacy to request a refill? no   Which pharmacy would you like this sent to? [facility]   Patient notified that their request is being sent to the clinical staff for review and that they should receive a response within 2 business days.

## 2021-09-02 NOTE — Telephone Encounter (Signed)
Patient aware and verbalized understanding. °

## 2021-09-06 ENCOUNTER — Encounter: Payer: Self-pay | Admitting: Family Medicine

## 2021-09-06 ENCOUNTER — Ambulatory Visit (INDEPENDENT_AMBULATORY_CARE_PROVIDER_SITE_OTHER): Payer: 59 | Admitting: Family Medicine

## 2021-09-06 VITALS — BP 110/69 | HR 87 | Temp 97.8°F | Ht 64.0 in | Wt 188.0 lb

## 2021-09-06 DIAGNOSIS — K219 Gastro-esophageal reflux disease without esophagitis: Secondary | ICD-10-CM

## 2021-09-06 DIAGNOSIS — H6593 Unspecified nonsuppurative otitis media, bilateral: Secondary | ICD-10-CM | POA: Diagnosis not present

## 2021-09-06 DIAGNOSIS — K8020 Calculus of gallbladder without cholecystitis without obstruction: Secondary | ICD-10-CM

## 2021-09-06 DIAGNOSIS — J069 Acute upper respiratory infection, unspecified: Secondary | ICD-10-CM | POA: Diagnosis not present

## 2021-09-06 MED ORDER — FLUTICASONE PROPIONATE 50 MCG/ACT NA SUSP
2.0000 | Freq: Every day | NASAL | 6 refills | Status: DC
Start: 2021-09-06 — End: 2022-04-11

## 2021-09-06 NOTE — Progress Notes (Signed)
Subjective:  [name], female    DOB: 06/24/1986, 35 y.o.   MRN: [id number]  Patient Care Team: Baruch Gouty, FNP as PCP - General (Family Medicine)   Chief Complaint:  Gastroesophageal Reflux (8 week follow up)   HPI: Alyssa [name] is a 35 y.o. female presenting on 09/06/2021 for Gastroesophageal Reflux (8 week follow up)   Pt presents today for follow up after initiation of PPI therapy for GERD and to review [place] reports. She reports her reflux symptoms have improved greatly with medications. She only has symptoms if she eats certain foods. States RUQ pain is minimal. No recent nausea or vomiting. US revealed cholelithiases without cholecystitis. She has not seen GI for this. No recent episodes of pain. She is recovering from an URI and has continued cough, congestion, and ear fullness. No fever, chills, weakness, shortness of breath, fatigue, or decreased urine output. She has been using saline nasal spray and Mucinex without complete resolve of symptoms.   Gastroesophageal Reflux She complains of abdominal pain (minimal), coughing and heartburn. She reports no belching, no chest pain, no choking, no dysphagia, no early satiety, no globus sensation, no hoarse voice, no nausea, no sore throat, no stridor, no tooth decay, no water brash or no wheezing. This is a recurrent problem. The problem occurs occasionally. The problem has been rapidly improving. The heartburn duration is several minutes. The heartburn is located in the substernum. The heartburn is of mild intensity. The heartburn does not wake her from sleep. The heartburn does not limit her activity. The heartburn doesn't change with position. The symptoms are aggravated by certain foods. Pertinent negatives include no anemia, fatigue, melena, muscle weakness, orthopnea or weight loss. She has tried a PPI for the symptoms. The treatment provided significant relief. Past procedures include an abdominal  ultrasound. Past procedures do not include an EGD, esophageal manometry, esophageal pH monitoring, H. pylori antibody titer or a UGI.     Relevant past medical, surgical, family, and social history reviewed and updated as indicated.  Allergies and medications reviewed and updated. Data reviewed: Chart in Epic.   Past Medical History:  Diagnosis Date   Acne    Asthma    Cholestasis    SVD (spontaneous vaginal delivery) 10/10/2017    Past Surgical History:  Procedure Laterality Date   WISDOM TOOTH EXTRACTION      Social History   Socioeconomic History   Marital status: Married    Spouse name: Not on file   Number of children: Not on file   Years of education: Not on file   Highest education level: Not on file  Occupational History   Not on file  Tobacco Use   Smoking status: Never   Smokeless tobacco: Never  Substance and Sexual Activity   Alcohol use: No   Drug use: No   Sexual activity: Not on file  Other Topics Concern   Not on file  Social History Narrative   Not on file   Social Determinants of Health   Financial Resource Strain: Not on file  Food Insecurity: Not on file  Transportation Needs: Not on file  Physical Activity: Not on file  Stress: Not on file  Social Connections: Not on file  Intimate Partner Violence: Not on file    Outpatient Encounter Medications as of 09/06/2021  Medication Sig   acetaminophen (TYLENOL) 500 MG tablet Take 500 mg by mouth every 6 (six) hours as needed for moderate pain  or headache.   albuterol (VENTOLIN HFA) 108 (90 Base) MCG/ACT inhaler Inhale 2 puffs into the lungs every 6 (six) hours as needed for wheezing or shortness of breath.   fluticasone (FLONASE) 50 MCG/ACT nasal spray Place 2 sprays into both nostrils daily.   Multiple Vitamin (MULTI-VITAMIN DAILY PO) Multi Vitamin   omeprazole (PRILOSEC) 20 MG capsule Take 1 capsule (20 mg total) by mouth at bedtime.   No facility-administered encounter medications on file  as of 09/06/2021.    Allergies  Allergen Reactions   Sulfa Antibiotics     Rash   Elemental Sulfur Rash   Sulfur Rash    Review of Systems  Constitutional:  Negative for activity change, appetite change, chills, diaphoresis, fatigue, fever, unexpected weight change and weight loss.  HENT:  Positive for congestion and ear pain. Negative for dental problem, drooling, ear discharge, facial swelling, hearing loss, hoarse voice, mouth sores, nosebleeds, postnasal drip, rhinorrhea, sinus pressure, sinus pain, sneezing, sore throat, tinnitus, trouble swallowing and voice change.   Eyes: Negative.   Respiratory:  Positive for cough. Negative for choking, shortness of breath and wheezing.   Cardiovascular:  Negative for chest pain and palpitations.  Gastrointestinal:  Positive for abdominal pain (minimal) and heartburn. Negative for abdominal distention, anal bleeding, blood in stool, constipation, diarrhea, dysphagia, melena, nausea, rectal pain and vomiting.  Endocrine: Negative.   Genitourinary:  Negative for decreased urine volume and difficulty urinating.  Musculoskeletal: Negative.  Negative for muscle weakness.  Skin: Negative.   Allergic/Immunologic: Negative.   Neurological: Negative.   Hematological: Negative.   Psychiatric/Behavioral: Negative.    All other systems reviewed and are negative.      Objective:  BP 110/69    Pulse 87    Temp 97.8 F (36.6 C)    Ht _0  (1.626 m)    Wt 188 lb (85.3 kg)    SpO2 99%    BMI 32.27 kg/m    Wt Readings from Last 3 Encounters:  09/06/21 188 lb (85.3 kg)  07/12/21 193 lb (87.5 kg)  10/10/17 208 lb (94.3 kg)    Physical Exam Vitals and nursing note reviewed.  Constitutional:      General: She is not in acute distress.    Appearance: Normal appearance. She is well-developed and well-groomed. She is obese. She is not ill-appearing, toxic-appearing or diaphoretic.  HENT:     Head: Normocephalic and atraumatic.     Jaw: There is  normal jaw occlusion.     Right Ear: Hearing normal.     Left Ear: Hearing normal.     Nose: Nose normal.     Mouth/Throat:     Lips: Pink.     Mouth: Mucous membranes are moist.     Pharynx: Oropharynx is clear. Uvula midline.  Eyes:     General: Lids are normal.     Extraocular Movements: Extraocular movements intact.     Conjunctiva/sclera: Conjunctivae normal.     Pupils: Pupils are equal, round, and reactive to light.  Neck:     Thyroid: No thyroid mass, thyromegaly or thyroid tenderness.     Vascular: No carotid bruit or JVD.     Trachea: Trachea and phonation normal.  Cardiovascular:     Rate and Rhythm: Normal rate and regular rhythm.     Chest Wall: PMI is not displaced.     Pulses: Normal pulses.     Heart sounds: Normal heart sounds. No murmur heard.   No friction rub. No gallop.  Pulmonary:     Effort: Pulmonary effort is normal. No respiratory distress.     Breath sounds: Normal breath sounds. No wheezing.  Abdominal:     General: Bowel sounds are normal. There is no distension or abdominal bruit.     Palpations: Abdomen is soft. There is no hepatomegaly or splenomegaly.     Tenderness: There is no abdominal tenderness. There is no right CVA tenderness or left CVA tenderness.     Hernia: No hernia is present.  Musculoskeletal:        General: Normal range of motion.     Cervical back: Normal range of motion and neck supple.     Right lower leg: No edema.     Left lower leg: No edema.  Lymphadenopathy:     Cervical: No cervical adenopathy.  Skin:    General: Skin is warm and dry.     Capillary Refill: Capillary refill takes less than 2 seconds.     Coloration: Skin is not cyanotic, jaundiced or pale.     Findings: No rash.  Neurological:     General: No focal deficit present.     Mental Status: She is alert and oriented to person, place, and time.     Sensory: Sensation is intact.     Motor: Motor function is intact.     Coordination: Coordination is  intact.     Gait: Gait is intact.     Deep Tendon Reflexes: Reflexes are normal and symmetric.  Psychiatric:        Attention and Perception: Attention and perception normal.        Mood and Affect: Mood and affect normal.        Speech: Speech normal.        Behavior: Behavior normal. Behavior is cooperative.        Thought Content: Thought content normal.        Cognition and Memory: Cognition and memory normal.        Judgment: Judgment normal.    Results for orders placed or performed in visit on 07/14/21  Anemia Profile B  Result Value Ref Range   Total Iron Binding Capacity 299 250 - 450 ug/dL   UIBC 218 131 - 425 ug/dL   Iron 81 27 - 159 ug/dL   Iron Saturation 27 15 - 55 %   Ferritin 60 15 - 150 ng/mL   Vitamin B-12 478 232 - 1,245 pg/mL   Folate 14.8 >3.0 ng/mL   WBC 7.4 3.4 - 10.8 x10E3/uL   RBC 4.66 3.77 - 5.28 x10E6/uL   Hemoglobin 13.0 11.1 - 15.9 g/dL   Hematocrit 40.1 34.0 - 46.6 %   MCV 86 79 - 97 fL   MCH 27.9 26.6 - 33.0 pg   MCHC 32.4 31.5 - 35.7 g/dL   RDW 13.2 11.7 - 15.4 %   Platelets 335 150 - 450 x10E3/uL   Neutrophils 56 Not Estab. %   Lymphs 37 Not Estab. %   Monocytes 6 Not Estab. %   Eos 1 Not Estab. %   Basos 0 Not Estab. %   Neutrophils Absolute 4.1 1.4 - 7.0 x10E3/uL   Lymphocytes Absolute 2.7 0.7 - 3.1 x10E3/uL   Monocytes Absolute 0.4 0.1 - 0.9 x10E3/uL   EOS (ABSOLUTE) 0.1 0.0 - 0.4 x10E3/uL   Basophils Absolute 0.0 0.0 - 0.2 x10E3/uL   Immature Granulocytes 0 Not Estab. %   Immature Grans (Abs) 0.0 0.0 - 0.1 x10E3/uL   Retic Ct  Pct 1.1 0.6 - 2.6 %  CMP14+EGFR  Result Value Ref Range   Glucose 83 70 - 99 mg/dL   BUN 12 6 - 20 mg/dL   Creatinine, Ser 0.76 0.57 - 1.00 mg/dL   eGFR 105 >59 mL/min/1.73   BUN/Creatinine Ratio 16 9 - 23   Sodium 139 134 - 144 mmol/L   Potassium 4.3 3.5 - 5.2 mmol/L   Chloride 101 96 - 106 mmol/L   CO2 23 20 - 29 mmol/L   Calcium 9.5 8.7 - 10.2 mg/dL   Total Protein 6.7 6.0 - 8.5 g/dL   Albumin 4.4  3.8 - 4.8 g/dL   Globulin, Total 2.3 1.5 - 4.5 g/dL   Albumin/Globulin Ratio 1.9 1.2 - 2.2   Bilirubin Total 0.5 0.0 - 1.2 mg/dL   Alkaline Phosphatase 68 44 - 121 IU/L   AST 18 0 - 40 IU/L   ALT 14 0 - 32 IU/L       Pertinent labs & imaging results that were available during my care of the patient were reviewed by me and considered in my medical decision making.  Assessment & Plan:  Channel was seen today for gastroesophageal reflux.  Diagnoses and all orders for this visit:  Gastroesophageal reflux disease without esophagitis Greatly improved with omeprazole, continue. No red flags present concerning or esophagitis or malignancy. Referral to GI.  -     Ambulatory referral to Gastroenterology  Calculus of gallbladder without cholecystitis without obstruction US results reviewed with pt. Referral to GI placed. Pt aware of symptoms which require emergent evaluation and treatment. Referral to GI to discuss treatment options.  -     Ambulatory referral to Gastroenterology  URI with cough and congestion Bilateral otitis media with effusion No indications of bacterial infection. Add Flonase to regimen. Depo-medrol 40 mg IM given in office today. Pt aware to continue symptomatic care at home and report any new or worsening symptoms.  -     fluticasone (FLONASE) 50 MCG/ACT nasal spray; Place 2 sprays into both nostrils daily.     Continue all other maintenance medications.  Follow up plan: Return in about 6 months (around 03/07/2022), or if symptoms worsen or fail to improve, for CPE.   Continue healthy lifestyle choices, including diet (rich in fruits, vegetables, and lean proteins, and low in salt and simple carbohydrates) and exercise (at least 30 minutes of moderate physical activity daily).  Educational handout given for GERD, cholelithiasis   The above assessment and management plan was discussed with the patient. The patient verbalized understanding of and has agreed to the  management plan. Patient is aware to call the clinic if they develop any new symptoms or if symptoms persist or worsen. Patient is aware when to return to the clinic for a follow-up visit. Patient educated on when it is appropriate to go to the emergency department.   Monia Pouch, FNP-C [facility] [phone]

## 2021-09-07 ENCOUNTER — Encounter: Payer: Self-pay | Admitting: Internal Medicine

## 2021-09-21 ENCOUNTER — Ambulatory Visit (INDEPENDENT_AMBULATORY_CARE_PROVIDER_SITE_OTHER): Payer: 59 | Admitting: Nurse Practitioner

## 2021-09-21 ENCOUNTER — Encounter: Payer: Self-pay | Admitting: Nurse Practitioner

## 2021-09-21 DIAGNOSIS — J020 Streptococcal pharyngitis: Secondary | ICD-10-CM

## 2021-09-21 MED ORDER — AMOXICILLIN-POT CLAVULANATE 875-125 MG PO TABS: 1.0000 | ORAL_TABLET | Freq: Two times a day (BID) | ORAL | 0 refills | Status: DC

## 2021-09-21 NOTE — Progress Notes (Signed)
° °  Virtual Visit  Note Due to COVID-19 pandemic this visit was conducted virtually. This visit type was conducted due to national recommendations for restrictions regarding the COVID-19 Pandemic (e.g. social distancing, sheltering in place) in an effort to limit this patient's exposure and mitigate transmission in our community. All issues noted in this document were discussed and addressed.  A physical exam was not performed with this format.  I connected with Alyssa [name] on 09/21/21 at 10:20 by telephone and verified that I am speaking with the correct person using two identifiers. Alyssa [name] is currently located at home and no one is currently with her during visit. The provider, Mary-Margaret Daphine Deutscher, FNP is located in their office at time of visit.  I discussed the limitations, risks, security and privacy concerns of performing an evaluation and management service by telephone and the availability of in person appointments. I also discussed with the patient that there may be a patient responsible charge related to this service. The patient expressed understanding and agreed to proceed.   History and Present Illness:  Sore Throat  This is a new problem. The current episode started yesterday. The problem has been gradually worsening. Neither side of throat is experiencing more pain than the other. Maximum temperature: has not checked for fever but has been hving cold chills nd body aches. The pain is at a severity of 8/10. The pain is moderate. Associated symptoms include congestion, coughing and headaches. Associated symptoms comments: myalgia . She has had exposure to strep. She has tried cool liquids and NSAIDs for the symptoms. The treatment provided mild relief.     Review of Systems  HENT:  Positive for congestion.   Respiratory:  Positive for cough.   Neurological:  Positive for headaches.    Observations/Objective: Alert and oriented- answers all questions  appropriately No distress Voice raspy No cough during visit  Assessment and Plan: Alyssa Sails Rancourt in today with chief complaint of sore throat  1. Strep pharyngitis Force fluids Motrin or tylenol OTC OTC decongestant Throat lozenges if help New toothbrush in 3 days Meds ordered this encounter  Medications   amoxicillin-clavulanate (AUGMENTIN) 875-125 MG tablet    Sig: Take 1 tablet by mouth 2 (two) times daily.    Dispense:  14 tablet    Refill:  0    Order Specific Question:   Supervising Provider    Answer:   Arville Care A [[id number]]        Follow Up Instructions: prn    I discussed the assessment and treatment plan with the patient. The patient was provided an opportunity to ask questions and all were answered. The patient agreed with the plan and demonstrated an understanding of the instructions.   The patient was advised to call back or seek an in-person evaluation if the symptoms worsen or if the condition fails to improve as anticipated.  The above assessment and management plan was discussed with the patient. The patient verbalized understanding of and has agreed to the management plan. Patient is aware to call the clinic if symptoms persist or worsen. Patient is aware when to return to the clinic for a follow-up visit. Patient educated on when it is appropriate to go to the emergency department.   Time call ended:  10:32  I provided 12 minutes of  non face-to-face time during this encounter.    Mary-Margaret Daphine Deutscher, FNP

## 2021-09-21 NOTE — Patient Instructions (Signed)
Strep Throat, Adult ?Strep throat is an infection of the throat. It is caused by germs (bacteria). Strep throat is common during the cold months of the year. It mostly affects children who are 5-34 years old. However, people of all ages can get it at any time of the year. This infection spreads from person to person through coughing, sneezing, or having close contact. ?What are the causes? ?This condition is caused by the Streptococcus pyogenes germ. ?What increases the risk? ?You care for young children. Children are more likely to get strep throat and may spread it to others. ?You go to crowded places. Germs can spread easily in such places. ?You kiss or touch someone who has strep throat. ?What are the signs or symptoms? ?Fever or chills. ?Redness, swelling, or pain in the tonsils or throat. ?Pain or trouble when swallowing. ?White or yellow spots on the tonsils or throat. ?Tender glands in the neck and under the jaw. ?Bad breath. ?Red rash all over the body. This is rare. ?How is this treated? ?Medicines that kill germs (antibiotics). ?Medicines that treat pain or fever. These include: ?Ibuprofen or acetaminophen. ?Aspirin, only for people who are over the age of 18. ?Cough drops. ?Throat sprays. ?Follow these instructions at home: ?Medicines ? ?Take over-the-counter and prescription medicines only as told by your doctor. ?Take your antibiotic medicine as told by your doctor. Do not stop taking the antibiotic even if you start to feel better. ?Eating and drinking ? ?If you have trouble swallowing, eat soft foods until your throat feels better. ?Drink enough fluid to keep your pee (urine) pale yellow. ?To help with pain, you may have: ?Warm fluids, such as soup and tea. ?Cold fluids, such as frozen desserts or popsicles. ?General instructions ?Rinse your mouth (gargle) with a salt-water mixture 3-4 times a day or as needed. To make a salt-water mixture, dissolve ?-1 tsp (3-6 g) of salt in 1 cup (237 mL) of warm  water. ?Rest as much as you can. ?Stay home from work or school until you have been taking antibiotics for 24 hours. ?Do not smoke or use any products that contain nicotine or tobacco. If you need help quitting, ask your doctor. ?Keep all follow-up visits. ?How is this prevented? ? ?Do not share food, drinking cups, or personal items. They can cause the germs to spread. ?Wash your hands well with soap and water. Make sure that all people in your house wash their hands well. ?Have family members tested if they have a fever or a sore throat. They may need an antibiotic if they have strep throat. ?Contact a doctor if: ?You have swelling in your neck that keeps getting bigger. ?You get a rash, cough, or earache. ?You cough up a thick fluid that is green, yellow-brown, or bloody. ?You have pain that does not get better with medicine. ?Your symptoms get worse instead of getting better. ?You have a fever. ?Get help right away if: ?You vomit. ?You have a very bad headache. ?Your neck hurts or feels stiff. ?You have chest pain or are short of breath. ?You have drooling, very bad throat pain, or changes in your voice. ?Your neck is swollen, or the skin gets red and tender. ?Your mouth is dry, or you are peeing less than normal. ?You keep feeling more tired or have trouble waking up. ?Your joints are red or painful. ?These symptoms may be an emergency. Do not wait to see if the symptoms will go away. Get help right away. Call   your local emergency services (911 in the [place] ?Summary ?Strep throat is an infection of the throat. It is caused by germs (bacteria). ?This infection can spread from person to person through coughing, sneezing, or having close contact. ?Take your medicines, including antibiotics, as told by your doctor. Do not stop taking the antibiotic even if you start to feel better. ?To prevent the spread of germs, wash your hands well with soap and water. Have others do the same. Do not share food, drinking cups,  or personal items. ?Get help right away if you have a bad headache, chest pain, shortness of breath, a stiff or painful neck, or you vomit. ?This information is not intended to replace advice given to you by your health care provider. Make sure you discuss any questions you have with your health care provider. ?Document Revised: 01/04/2021 Document Reviewed: 01/04/2021 ?Elsevier Patient Education ? 2022 [employer] ? ?

## 2021-10-06 ENCOUNTER — Ambulatory Visit: Payer: 59 | Admitting: Internal Medicine

## 2021-10-19 ENCOUNTER — Ambulatory Visit: Payer: 59 | Admitting: Internal Medicine

## 2021-11-23 ENCOUNTER — Other Ambulatory Visit: Payer: Self-pay | Admitting: Family Medicine

## 2021-11-23 DIAGNOSIS — K219 Gastro-esophageal reflux disease without esophagitis: Secondary | ICD-10-CM

## 2021-11-29 ENCOUNTER — Encounter: Payer: Self-pay | Admitting: Internal Medicine

## 2021-11-29 ENCOUNTER — Ambulatory Visit (INDEPENDENT_AMBULATORY_CARE_PROVIDER_SITE_OTHER): Payer: 59 | Admitting: Internal Medicine

## 2021-11-29 VITALS — BP 100/60 | HR 93 | Ht 64.0 in | Wt 189.0 lb

## 2021-11-29 DIAGNOSIS — K8020 Calculus of gallbladder without cholecystitis without obstruction: Secondary | ICD-10-CM | POA: Diagnosis not present

## 2021-11-29 DIAGNOSIS — R109 Unspecified abdominal pain: Secondary | ICD-10-CM | POA: Diagnosis not present

## 2021-11-29 MED ORDER — URSODIOL 300 MG PO CAPS: 600.0000 mg | ORAL_CAPSULE | Freq: Every day | ORAL | 5 refills | Status: DC

## 2021-11-29 NOTE — Patient Instructions (Signed)
We have sent the following medications to your pharmacy for you to pick up at your convenience: Ursodiol 300 mg taking 2 tablets (600 mg) by mouth daily.  ? ?Please follow up with Dr. Lorenso Courier in 2 months.  ? ?The [place] [facility] providers would like to encourage you to use [facility] to communicate with providers for non-urgent requests or questions.  Due to long hold times on the telephone, sending your provider a message by [facility] may be a faster and more efficient way to get a response.  Please allow 48 business hours for a response.  Please remember that this is for non-urgent requests.  ? ?

## 2021-11-29 NOTE — Progress Notes (Signed)
? ?Chief Complaint: Cholelithiasis ? ?HPI : 36 year female with history of asthma and anxiety presents with cholelithiasis ? ?She had abdominal pain that she felt was consistent with gallbladder attacks in 06/2021 and 08/2021.  The pain starts in her flank and then travels to the RUQ.  The pain will usually last for few hours.  She will have N&V during these episodes. She thinks that fried foods and sweets cause the flares to occur. She has considered getting her gallbladder out but would like to avoid surgery if possible. She did have GERD but this is well onctrolled omeprazole 20 mg QD. Denies blood in stools or diarrhea or constipation. She has fam hx of colon polyps in her mother. Maternal GM had colon cancer. Denies dysphagia.  She and her husband are currently trying to have a baby ? ?Wt Readings from Last 3 Encounters:  ?11/29/21 189 lb (85.7 kg)  ?09/06/21 188 lb (85.3 kg)  ?07/12/21 193 lb (87.5 kg)  ? ?Past Medical History:  ?Diagnosis Date  ? Acne   ? Anxiety   ? Asthma   ? onset as a baby  ? Cholestasis   ? Hepatitis A   ? age 54, in second grade  ? SVD (spontaneous vaginal delivery) 10/10/2017  ? ? ? ?Past Surgical History:  ?Procedure Laterality Date  ? WISDOM TOOTH EXTRACTION    ? ?Family History  ?Problem Relation Age of Onset  ? Healthy Mother   ? Colon polyps Mother   ? Healthy Father   ? Healthy Sister   ? Cancer Maternal Grandmother   ?     Colorectal  ? Heart disease Paternal Grandmother   ? Heart disease Paternal Grandfather   ? ?Social History  ? ?Tobacco Use  ? Smoking status: Never  ? Smokeless tobacco: Never  ?Vaping Use  ? Vaping Use: Never used  ?Substance Use Topics  ? Alcohol use: No  ? Drug use: No  ? ?Current Outpatient Medications  ?Medication Sig Dispense Refill  ? acetaminophen (TYLENOL) 500 MG tablet Take 500 mg by mouth every 6 (six) hours as needed for moderate pain or headache.    ? albuterol (VENTOLIN HFA) 108 (90 Base) MCG/ACT inhaler Inhale 2 puffs into the lungs every 6  (six) hours as needed for wheezing or shortness of breath. 8 g 2  ? amoxicillin-clavulanate (AUGMENTIN) 875-125 MG tablet Take 1 tablet by mouth 2 (two) times daily. 14 tablet 0  ? fluticasone (FLONASE) 50 MCG/ACT nasal spray Place 2 sprays into both nostrils daily. 16 g 6  ? Multiple Vitamin (MULTI-VITAMIN DAILY PO) Multi Vitamin    ? omeprazole (PRILOSEC) 20 MG capsule TAKE 1 CAPSULE BY MOUTH AT BEDTIME. 30 capsule 2  ? ?No current facility-administered medications for this visit.  ? ?Allergies  ?Allergen Reactions  ? Sulfa Antibiotics   ?  Rash  ? Elemental Sulfur Rash  ? Sulfur Rash  ? ? ? ?Review of Systems: ?All systems reviewed and negative except where noted in HPI.  ? ?Physical Exam: ?BP 100/60   Pulse 93   Ht 5\' 4"  (1.626 m)   Wt 189 lb (85.7 kg)   LMP  (LMP Unknown)   BMI 32.44 kg/m?  ?Constitutional: Pleasant,well-developed, female in no acute distress. ?HEENT: Normocephalic and atraumatic. Conjunctivae are normal. No scleral icterus. ?Cardiovascular: Normal rate, regular rhythm.  ?Pulmonary/chest: Effort normal and breath sounds normal. No wheezing, rales or rhonchi. ?Abdominal: Soft, nondistended, nontender. Bowel sounds active throughout. There are no masses palpable.  No hepatomegaly. ?Extremities: No edema ?Neurological: Alert and oriented to person place and time. ?Skin: Skin is warm and dry. No rashes noted. ?Psychiatric: Normal mood and affect. Behavior is normal. ? ?Labs 06/2021: CMP nml. Lipase nml. CBC nml. ? ?RUQ U/S 07/19/21: ?FINDINGS: ?Gallbladder: ?5 mm echogenic stones seen layering within the gallbladder lumen. ?Gallbladder wall measures within normal limits at 1.5 mm. No free ?pericholecystic fluid. No sonographic Murphy sign elicited on exam. ?IMPRESSION: ?1. Cholelithiasis without sonographic features for acute ?cholecystitis. ?2. No biliary dilatation. ? ?ASSESSMENT AND PLAN: ?Cholelithiasis ?Episodic ab pain ?Patient presents with right upper quadrant abdominal pain that could  be consistent with biliary colic.  She would like to avoid having her gallbladder taken out if possible.  Her last abdominal ultrasound showed that her gallstones are quite small with size noted as 5 mm.  Thus she would be a reasonable candidate for ursodiol.  We will plan to start her on ursodiol 600 mg daily, which can also be tolerated in pregnancy.  She will come back in 2 months to check in to see how she is feeling at that time.  Patient does have also underlying reflux issues, which she states are well controlled on once a day PPI therapy.  Her abdominal pain could also potentially be due to GERD or PUD.  We will see if she has any additional episodes of abdominal pain in the future ?- Follow a low-fat diet ?- Start ursodiol ?- RTC 2 months ? ?Eulah Pont, MD ? ?

## 2022-01-16 LAB — HM PAP SMEAR: HPV, high-risk: NEGATIVE

## 2022-02-01 ENCOUNTER — Ambulatory Visit: Payer: 59 | Admitting: Internal Medicine

## 2022-03-05 ENCOUNTER — Other Ambulatory Visit: Payer: Self-pay | Admitting: Family Medicine

## 2022-03-05 DIAGNOSIS — K219 Gastro-esophageal reflux disease without esophagitis: Secondary | ICD-10-CM

## 2022-04-04 ENCOUNTER — Other Ambulatory Visit: Payer: Self-pay | Admitting: Family Medicine

## 2022-04-04 DIAGNOSIS — K219 Gastro-esophageal reflux disease without esophagitis: Secondary | ICD-10-CM

## 2022-04-11 ENCOUNTER — Encounter: Payer: Self-pay | Admitting: Family Medicine

## 2022-04-11 ENCOUNTER — Ambulatory Visit (INDEPENDENT_AMBULATORY_CARE_PROVIDER_SITE_OTHER): Payer: Commercial Managed Care - PPO | Admitting: Family Medicine

## 2022-04-11 DIAGNOSIS — K219 Gastro-esophageal reflux disease without esophagitis: Secondary | ICD-10-CM

## 2022-04-11 MED ORDER — OMEPRAZOLE 20 MG PO CPDR: 20.0000 mg | DELAYED_RELEASE_CAPSULE | Freq: Every day | ORAL | 11 refills | Status: DC

## 2022-04-11 NOTE — Progress Notes (Signed)
Subjective:  [name], female    DOB: 1986-04-20, 36 y.o.   MRN: [id number]  Patient Care Team: Baruch Gouty, FNP as PCP - General (Family Medicine)   Chief Complaint:  Medical Management of Chronic Issues (Acid reflux medication)   HPI: Alyssa [name] is a 36 y.o. female presenting on 04/11/2022 for Medical Management of Chronic Issues (Acid reflux medication)   Pt presents today for GERD follow up after starting PPI therapy 08/2021. She has been doing very well with medications.   Gastroesophageal Reflux She reports no abdominal pain, no belching, no chest pain, no choking, no coughing, no dysphagia, no early satiety, no globus sensation, no heartburn, no hoarse voice, no nausea, no sore throat, no stridor, no tooth decay, no water brash or no wheezing. Pertinent negatives include no anemia, fatigue, melena, muscle weakness, orthopnea or weight loss. Risk factors include obesity. She has tried a PPI for the symptoms. The treatment provided significant relief.     Relevant past medical, surgical, family, and social history reviewed and updated as indicated.  Allergies and medications reviewed and updated. Data reviewed: Chart in Epic.   Past Medical History:  Diagnosis Date   Acne    Anxiety    Asthma    onset as a baby   Cholestasis    Hepatitis A    age 47, in second grade   SVD (spontaneous vaginal delivery) 10/10/2017    Past Surgical History:  Procedure Laterality Date   WISDOM TOOTH EXTRACTION      Social History   Socioeconomic History   Marital status: Married    Spouse name: Josh   Number of children: 1   Years of education: Not on file   Highest education level: Not on file  Occupational History   Occupation: stay at home mom  Tobacco Use   Smoking status: Never   Smokeless tobacco: Never  Vaping Use   Vaping Use: Never used  Substance and Sexual Activity   Alcohol use: No   Drug use: No   Sexual activity: Not  on file  Other Topics Concern   Not on file  Social History Narrative   Not on file   Social Determinants of Health   Financial Resource Strain: Not on file  Food Insecurity: Not on file  Transportation Needs: Not on file  Physical Activity: Not on file  Stress: Not on file  Social Connections: Not on file  Intimate Partner Violence: Not on file    Outpatient Encounter Medications as of 04/11/2022  Medication Sig   albuterol (VENTOLIN HFA) 108 (90 Base) MCG/ACT inhaler Inhale 2 puffs into the lungs every 6 (six) hours as needed for wheezing or shortness of breath.   Multiple Vitamin (MULTI-VITAMIN DAILY PO) Multi Vitamin   [DISCONTINUED] omeprazole (PRILOSEC) 20 MG capsule Take 1 capsule (20 mg total) by mouth at bedtime. (NEEDS TO BE SEEN BEFORE NEXT REFILL)   omeprazole (PRILOSEC) 20 MG capsule Take 1 capsule (20 mg total) by mouth at bedtime. (NEEDS TO BE SEEN BEFORE NEXT REFILL)   [DISCONTINUED] acetaminophen (TYLENOL) 500 MG tablet Take 500 mg by mouth every 6 (six) hours as needed for moderate pain or headache.   [DISCONTINUED] amoxicillin-clavulanate (AUGMENTIN) 875-125 MG tablet Take 1 tablet by mouth 2 (two) times daily.   [DISCONTINUED] fluticasone (FLONASE) 50 MCG/ACT nasal spray Place 2 sprays into both nostrils daily.   [DISCONTINUED] ursodiol (ACTIGALL) 300 MG capsule Take 2 capsules (600 mg total)  by mouth daily.   No facility-administered encounter medications on file as of 04/11/2022.    Allergies  Allergen Reactions   Sulfa Antibiotics     Rash   Elemental Sulfur Rash   Sulfur Rash    Review of Systems  Constitutional:  Negative for activity change, appetite change, chills, diaphoresis, fatigue, fever, unexpected weight change and weight loss.  HENT: Negative.  Negative for hoarse voice, sore throat, trouble swallowing and voice change.   Eyes: Negative.   Respiratory:  Negative for cough, choking, chest tightness, shortness of breath and wheezing.    Cardiovascular:  Negative for chest pain, palpitations and leg swelling.  Gastrointestinal:  Negative for abdominal pain, blood in stool, constipation, diarrhea, dysphagia, heartburn, melena, nausea and vomiting.  Endocrine: Negative.   Genitourinary:  Negative for dysuria, frequency and urgency.  Musculoskeletal:  Negative for arthralgias, myalgias and muscle weakness.  Skin: Negative.   Allergic/Immunologic: Negative.   Neurological:  Negative for dizziness, weakness and headaches.  Hematological: Negative.   Psychiatric/Behavioral:  Negative for confusion, hallucinations, sleep disturbance and suicidal ideas.   All other systems reviewed and are negative.       Objective:  BP 112/70   Pulse 83   Temp 98.7 F (37.1 C)   Ht 5' 4"  (1.626 m)   Wt 191 lb (86.6 kg)   LMP 03/30/2022   SpO2 99%   BMI 32.79 kg/m    Wt Readings from Last 3 Encounters:  04/11/22 191 lb (86.6 kg)  11/29/21 189 lb (85.7 kg)  09/06/21 188 lb (85.3 kg)    Physical Exam Vitals and nursing note reviewed.  Constitutional:      General: She is not in acute distress.    Appearance: Normal appearance. She is obese. She is not ill-appearing, toxic-appearing or diaphoretic.  HENT:     Head: Normocephalic and atraumatic.  Eyes:     Pupils: Pupils are equal, round, and reactive to light.  Cardiovascular:     Rate and Rhythm: Normal rate and regular rhythm.     Heart sounds: Normal heart sounds. No murmur heard.    No friction rub. No gallop.  Pulmonary:     Effort: Pulmonary effort is normal.     Breath sounds: Normal breath sounds.  Skin:    General: Skin is warm and dry.     Capillary Refill: Capillary refill takes less than 2 seconds.  Neurological:     General: No focal deficit present.     Mental Status: She is alert and oriented to person, place, and time.  Psychiatric:        Mood and Affect: Mood normal.        Behavior: Behavior normal.        Thought Content: Thought content normal.         Judgment: Judgment normal.     Results for orders placed or performed in visit on 07/14/21  Anemia Profile B  Result Value Ref Range   Total Iron Binding Capacity 299 250 - 450 ug/dL   UIBC 218 131 - 425 ug/dL   Iron 81 27 - 159 ug/dL   Iron Saturation 27 15 - 55 %   Ferritin 60 15 - 150 ng/mL   Vitamin B-12 478 232 - 1,245 pg/mL   Folate 14.8 >3.0 ng/mL   WBC 7.4 3.4 - 10.8 x10E3/uL   RBC 4.66 3.77 - 5.28 x10E6/uL   Hemoglobin 13.0 11.1 - 15.9 g/dL   Hematocrit 40.1 34.0 - 46.6 %  MCV 86 79 - 97 fL   MCH 27.9 26.6 - 33.0 pg   MCHC 32.4 31.5 - 35.7 g/dL   RDW 13.2 11.7 - 15.4 %   Platelets 335 150 - 450 x10E3/uL   Neutrophils 56 Not Estab. %   Lymphs 37 Not Estab. %   Monocytes 6 Not Estab. %   Eos 1 Not Estab. %   Basos 0 Not Estab. %   Neutrophils Absolute 4.1 1.4 - 7.0 x10E3/uL   Lymphocytes Absolute 2.7 0.7 - 3.1 x10E3/uL   Monocytes Absolute 0.4 0.1 - 0.9 x10E3/uL   EOS (ABSOLUTE) 0.1 0.0 - 0.4 x10E3/uL   Basophils Absolute 0.0 0.0 - 0.2 x10E3/uL   Immature Granulocytes 0 Not Estab. %   Immature Grans (Abs) 0.0 0.0 - 0.1 x10E3/uL   Retic Ct Pct 1.1 0.6 - 2.6 %  CMP14+EGFR  Result Value Ref Range   Glucose 83 70 - 99 mg/dL   BUN 12 6 - 20 mg/dL   Creatinine, Ser 0.76 0.57 - 1.00 mg/dL   eGFR 105 >59 mL/min/1.73   BUN/Creatinine Ratio 16 9 - 23   Sodium 139 134 - 144 mmol/L   Potassium 4.3 3.5 - 5.2 mmol/L   Chloride 101 96 - 106 mmol/L   CO2 23 20 - 29 mmol/L   Calcium 9.5 8.7 - 10.2 mg/dL   Total Protein 6.7 6.0 - 8.5 g/dL   Albumin 4.4 3.8 - 4.8 g/dL   Globulin, Total 2.3 1.5 - 4.5 g/dL   Albumin/Globulin Ratio 1.9 1.2 - 2.2   Bilirubin Total 0.5 0.0 - 1.2 mg/dL   Alkaline Phosphatase 68 44 - 121 IU/L   AST 18 0 - 40 IU/L   ALT 14 0 - 32 IU/L       Pertinent labs & imaging results that were available during my care of the patient were reviewed by me and considered in my medical decision making.  Assessment & Plan:  Krisandra was seen today for  medical management of chronic issues.  Diagnoses and all orders for this visit:  Gastroesophageal reflux disease without esophagitis Symptoms have been very well controlled. Will taper off of PPI therapy over the next 2-3 weeks. If symptoms return, can restart medications. No red flags concerning for esophagitis or malignancy.  -     omeprazole (PRILOSEC) 20 MG capsule; Take 1 capsule (20 mg total) by mouth at bedtime. (NEEDS TO BE SEEN BEFORE NEXT REFILL)     Continue all other maintenance medications.  Follow up plan: Return in about 6 months (around 10/12/2022), or if symptoms worsen or fail to improve, for CPE.   Continue healthy lifestyle choices, including diet (rich in fruits, vegetables, and lean proteins, and low in salt and simple carbohydrates) and exercise (at least 30 minutes of moderate physical activity daily).   The above assessment and management plan was discussed with the patient. The patient verbalized understanding of and has agreed to the management plan. Patient is aware to call the clinic if they develop any new symptoms or if symptoms persist or worsen. Patient is aware when to return to the clinic for a follow-up visit. Patient educated on when it is appropriate to go to the emergency department.   Monia Pouch, FNP-C [facility] [phone]

## 2022-09-14 ENCOUNTER — Telehealth: Payer: Commercial Managed Care - PPO | Admitting: Family Medicine

## 2022-11-10 IMAGING — US US ABDOMEN LIMITED
1 series · 14 of 25 positions shown · non-contrast
Comparison: None available.

CLINICAL DATA: Initial evaluation for right upper quadrant pain
with nausea and vomiting for 3 weeks.

EXAM:
ULTRASOUND ABDOMEN LIMITED RIGHT UPPER QUADRANT

[Series 1: us abdomen limited · 0.22mm/px · 14 of 81 slices shown]
[im 1/81]
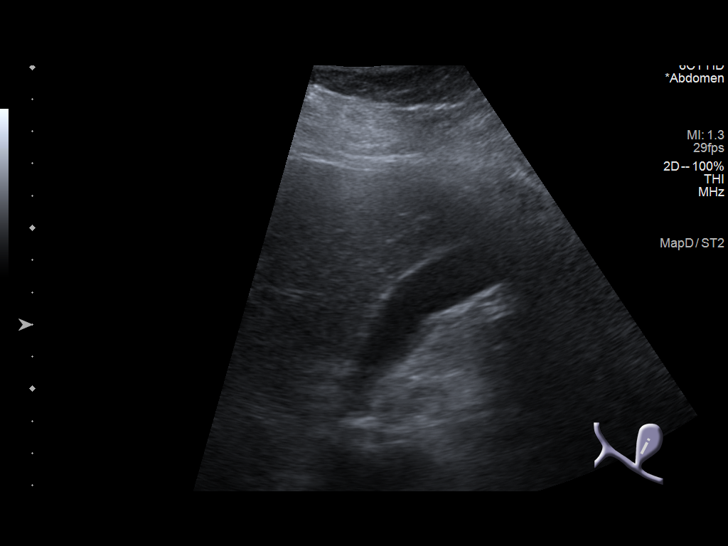
[im 7/81]
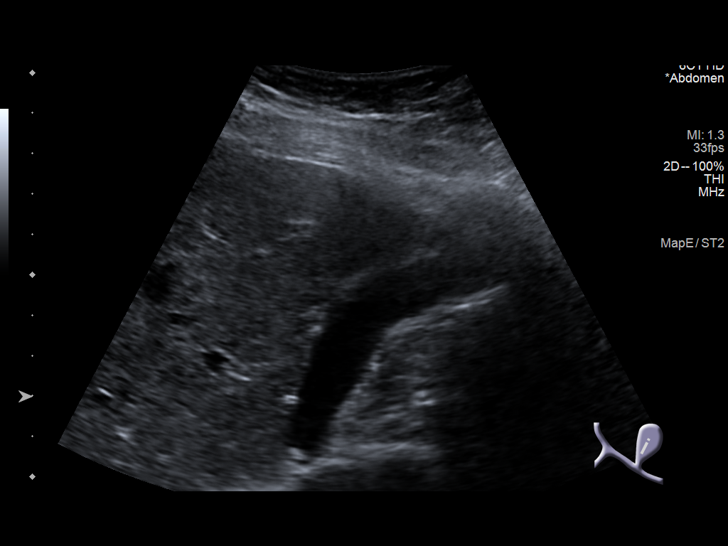
[im 14/81]
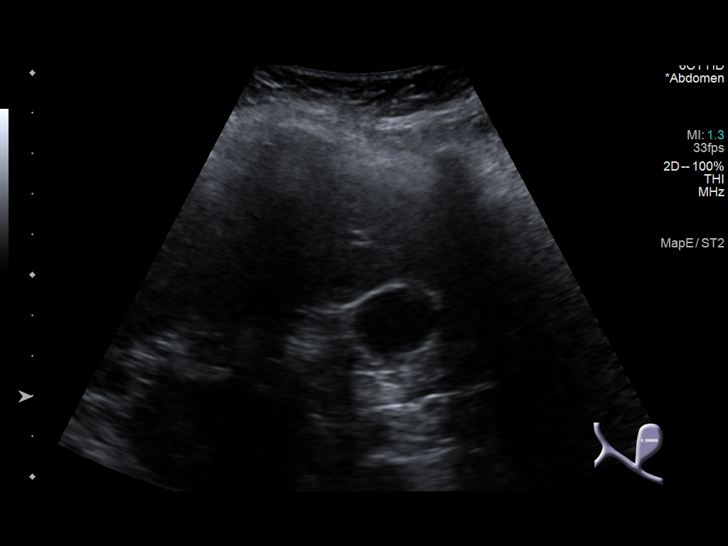
[im 21/81]
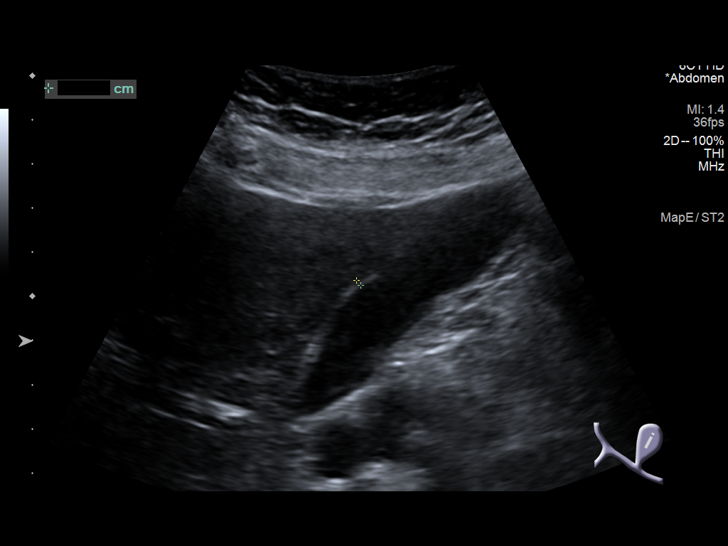
[im 27/81]
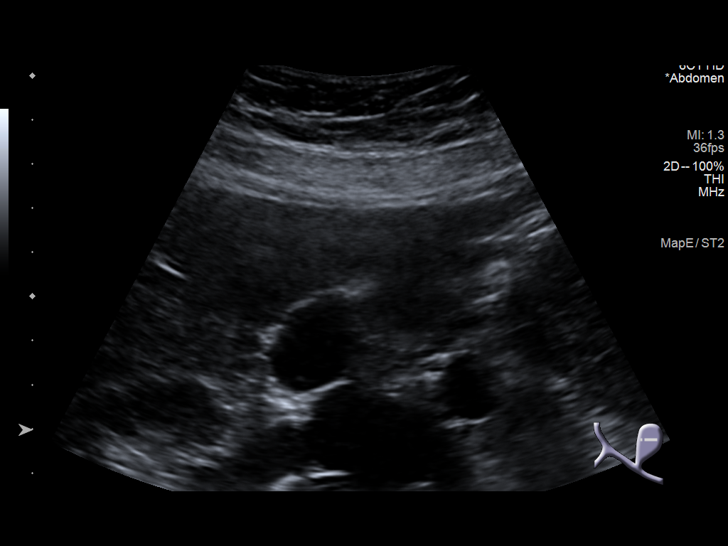
[im 31/81]
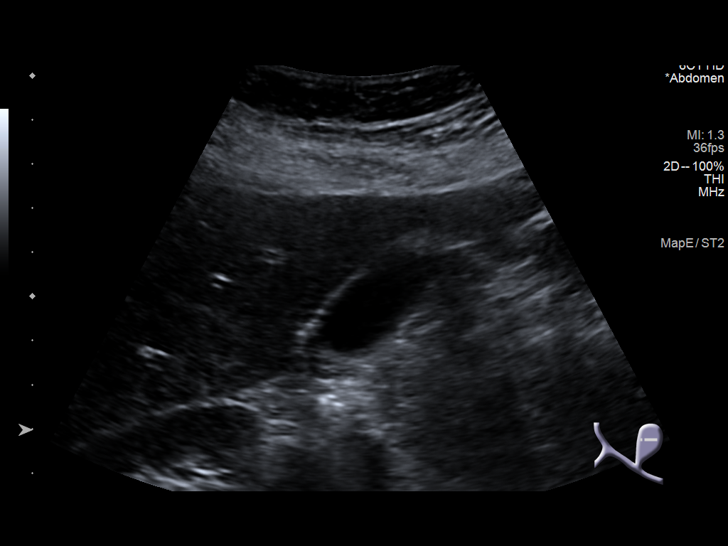
[im 37/81]
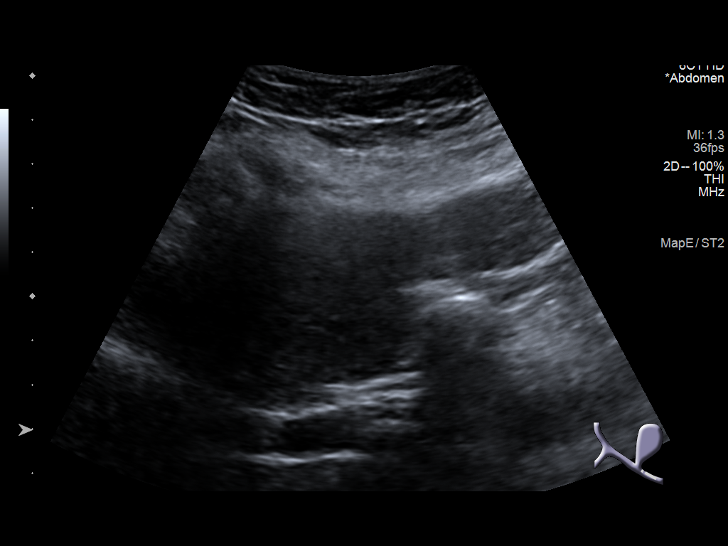
[im 44/81]
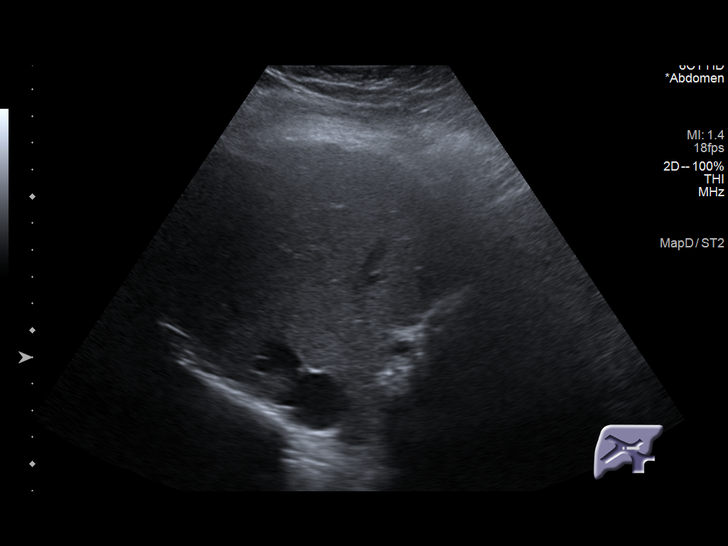
[im 51/81]
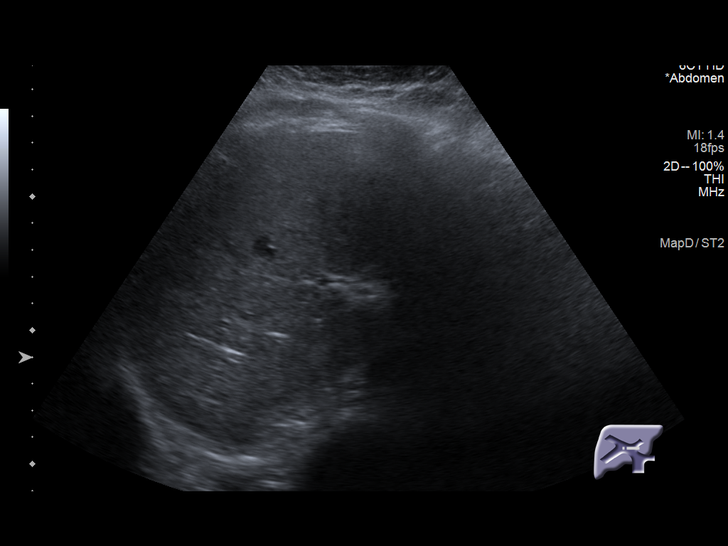
[im 54/81]
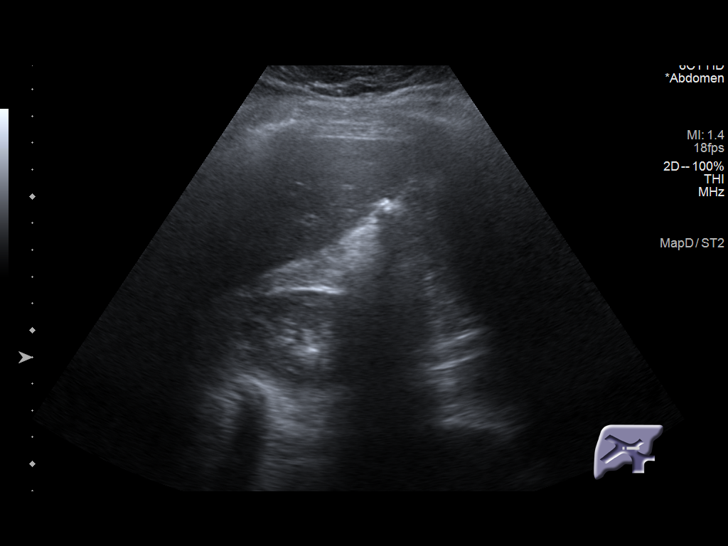
[im 61/81]
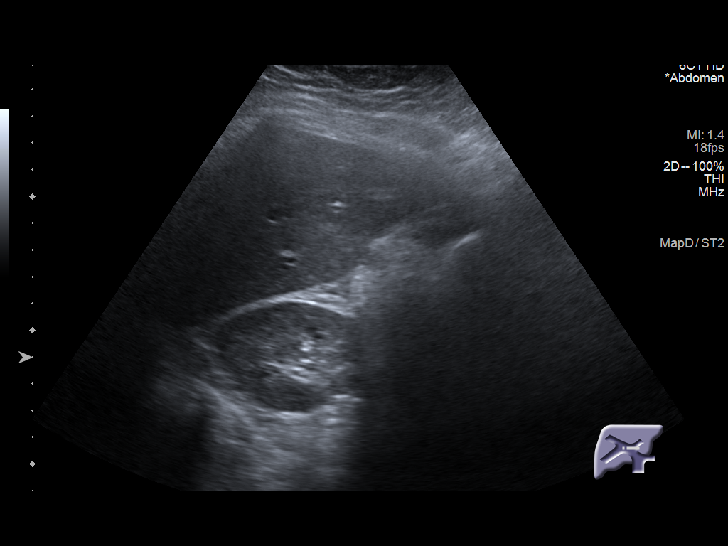
[im 67/81]
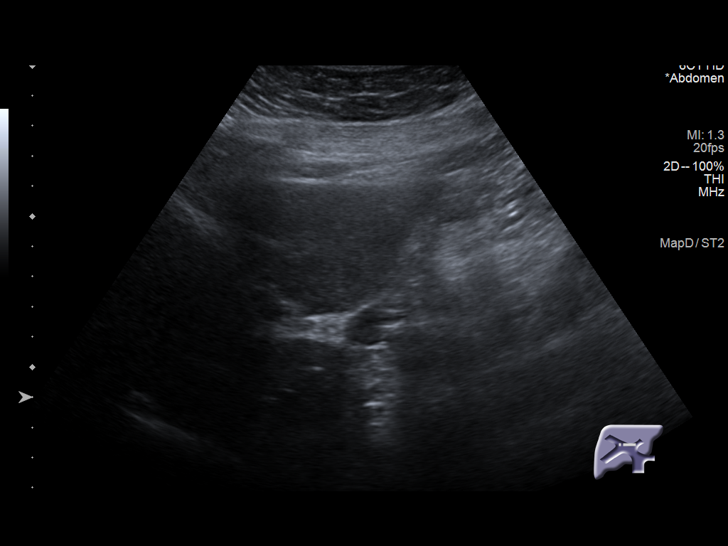
[im 74/81]
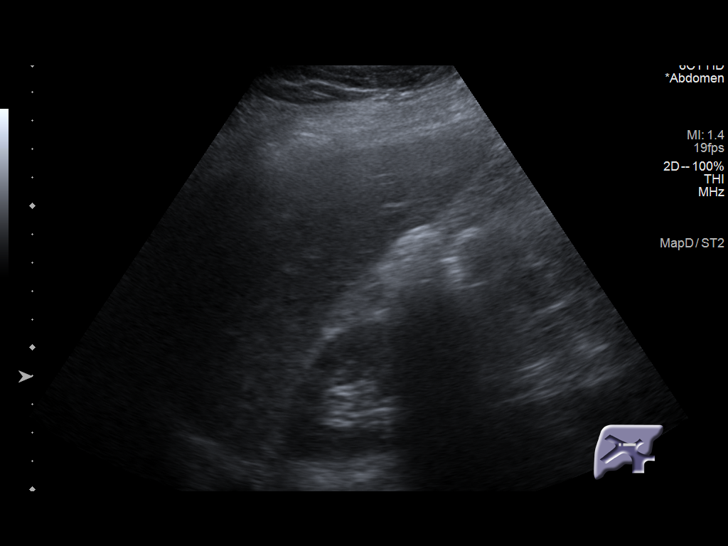
[im 81/81]
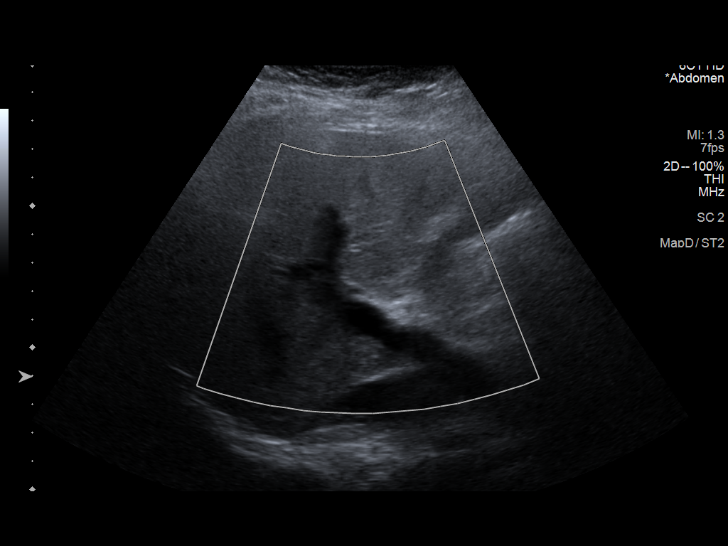

[14 of 25 positions shown; findings below may reference images not displayed]

FINDINGS: Gallbladder:

5 mm echogenic stones seen layering within the gallbladder lumen.
Gallbladder wall measures within normal limits at 1.5 mm. No free
pericholecystic fluid. No sonographic Murphy sign elicited on exam.

Common bile duct:

Diameter: 3.1 mm

Liver:

No focal lesion identified. Within normal limits in parenchymal
echogenicity. Portal vein is patent on color Doppler imaging with
normal direction of blood flow towards the liver.

Other: None.
IMPRESSION: 1. Cholelithiasis without sonographic features for acute
cholecystitis.
2. No biliary dilatation.

## 2023-02-27 ENCOUNTER — Ambulatory Visit (INDEPENDENT_AMBULATORY_CARE_PROVIDER_SITE_OTHER): Payer: Commercial Managed Care - PPO | Admitting: Family Medicine

## 2023-02-27 ENCOUNTER — Encounter: Payer: Self-pay | Admitting: Family Medicine

## 2023-02-27 VITALS — BP 102/66 | HR 76 | Temp 97.8°F | Resp 20 | Ht 64.0 in | Wt 197.0 lb

## 2023-02-27 DIAGNOSIS — Z6833 Body mass index (BMI) 33.0-33.9, adult: Secondary | ICD-10-CM | POA: Diagnosis not present

## 2023-02-27 DIAGNOSIS — K219 Gastro-esophageal reflux disease without esophagitis: Secondary | ICD-10-CM | POA: Diagnosis not present

## 2023-02-27 DIAGNOSIS — E559 Vitamin D deficiency, unspecified: Secondary | ICD-10-CM

## 2023-02-27 DIAGNOSIS — J4520 Mild intermittent asthma, uncomplicated: Secondary | ICD-10-CM | POA: Diagnosis not present

## 2023-02-27 DIAGNOSIS — R5383 Other fatigue: Secondary | ICD-10-CM

## 2023-02-27 DIAGNOSIS — R5381 Other malaise: Secondary | ICD-10-CM

## 2023-02-27 LAB — CMP14+EGFR
Alkaline Phosphatase: 67 IU/L (ref 44–121)
BUN/Creatinine Ratio: 13 (ref 9–23)
BUN: 10 mg/dL (ref 6–20)
Bilirubin Total: 0.3 mg/dL (ref 0.0–1.2)
CO2: 25 mmol/L (ref 20–29)
Calcium: 9.7 mg/dL (ref 8.7–10.2)
Chloride: 103 mmol/L (ref 96–106)
Globulin, Total: 2.3 g/dL (ref 1.5–4.5)
eGFR: 102 mL/min/{1.73_m2} (ref >59)

## 2023-02-27 LAB — LIPID PANEL

## 2023-02-27 LAB — CBC WITH DIFFERENTIAL/PLATELET

## 2023-02-27 LAB — VITAMIN D 25 HYDROXY (VIT D DEFICIENCY, FRACTURES)

## 2023-02-27 LAB — THYROID PANEL WITH TSH

## 2023-02-27 MED ORDER — OMEPRAZOLE 20 MG PO CPDR: 20.0000 mg | DELAYED_RELEASE_CAPSULE | Freq: Every day | ORAL | 3 refills | Status: DC

## 2023-02-27 NOTE — Progress Notes (Signed)
Subjective:  [name], female    DOB: Oct 23, 1985, 37 y.o.   MRN: [id number]  Patient Care Team: Sonny Masters, FNP as PCP - General (Family Medicine)   Chief Complaint:  Medical Management of Chronic Issues   HPI: Alyssa [name] is a 37 y.o. female presenting on 02/27/2023 for Medical Management of Chronic Issues   1. Gastroesophageal reflux disease without esophagitis Compliant with medications - Yes Current medications - omeprazole Adverse side effects - No Cough - No Sore throat - No Voice change - No Hemoptysis - No Dysphagia or dyspepsia - No Water brash - No Red Flags (weight loss, hematochezia, melena, weight loss, early satiety, fevers, odynophagia, or persistent vomiting) - No   2. Vitamin D deficiency Pt is taking oral repletion therapy. Denies bone pain and tenderness, muscle weakness, fracture, and difficulty walking. Lab Results  Component Value Date   VD25OH 34.7 07/12/2021   Lab Results  Component Value Date   CALCIUM 9.5 07/14/2021      3. BMI 33.0-33.9,adult Does try to follow a healthy diet. Has not been exercising. Reports worsening malaise and fatigue over the last several months. Thought this was due to be a mother but her child is  and she still feels fatigue and malaise.   4. Mild intermittent asthma without complication Well controlled with as needed Albuterol. Has not needed to use lately. Has been taking an allergy pill which helps to mitigate symptoms.       Relevant past medical, surgical, family, and social history reviewed and updated as indicated.  Allergies and medications reviewed and updated. Data reviewed: Chart in Epic.   Past Medical History:  Diagnosis Date   Acne    Anxiety    Asthma    onset as a baby   Cholestasis    Hepatitis A    age 36, in second grade   SVD (spontaneous vaginal delivery) 10/10/2017    Past Surgical History:  Procedure Laterality Date   WISDOM TOOTH  EXTRACTION      Social History   Socioeconomic History   Marital status: Married    Spouse name: Josh   Number of children: 1   Years of education: Not on file   Highest education level: Not on file  Occupational History   Occupation: stay at home mom  Tobacco Use   Smoking status: Never   Smokeless tobacco: Never  Vaping Use   Vaping Use: Never used  Substance and Sexual Activity   Alcohol use: No   Drug use: No   Sexual activity: Not on file  Other Topics Concern   Not on file  Social History Narrative   Not on file   Social Determinants of Health   Financial Resource Strain: Not on file  Food Insecurity: Not on file  Transportation Needs: Not on file  Physical Activity: Not on file  Stress: Not on file  Social Connections: Not on file  Intimate Partner Violence: Not on file    Outpatient Encounter Medications as of 02/27/2023  Medication Sig   albuterol (VENTOLIN HFA) 108 (90 Base) MCG/ACT inhaler Inhale 2 puffs into the lungs every 6 (six) hours as needed for wheezing or shortness of breath.   Multiple Vitamin (MULTI-VITAMIN DAILY PO) Multi Vitamin   [DISCONTINUED] omeprazole (PRILOSEC) 20 MG capsule Take 1 capsule (20 mg total) by mouth at bedtime. (NEEDS TO BE SEEN BEFORE NEXT REFILL)   omeprazole (PRILOSEC) 20 MG capsule Take 1  capsule (20 mg total) by mouth at bedtime. (NEEDS TO BE SEEN BEFORE NEXT REFILL)   No facility-administered encounter medications on file as of 02/27/2023.    Allergies  Allergen Reactions   Sulfa Antibiotics     Rash   Elemental Sulfur Rash   Sulfur Rash    Review of Systems  Constitutional:  Positive for activity change and fatigue. Negative for appetite change, chills, diaphoresis, fever and unexpected weight change.  HENT: Negative.    Eyes: Negative.  Negative for photophobia and visual disturbance.  Respiratory:  Negative for cough, chest tightness and shortness of breath.   Cardiovascular:  Negative for chest pain,  palpitations and leg swelling.  Gastrointestinal:  Positive for abdominal pain (intermittent GERD). Negative for abdominal distention, anal bleeding, blood in stool, constipation, diarrhea, nausea, rectal pain and vomiting.  Endocrine: Negative.  Negative for cold intolerance, heat intolerance, polydipsia, polyphagia and polyuria.  Genitourinary:  Negative for decreased urine volume, difficulty urinating, dysuria, frequency and urgency.  Musculoskeletal:  Negative for arthralgias and myalgias.  Skin: Negative.   Allergic/Immunologic: Negative.   Neurological:  Negative for dizziness, tremors, seizures, syncope, facial asymmetry, speech difficulty, weakness, light-headedness, numbness and headaches.  Hematological: Negative.   Psychiatric/Behavioral:  Negative for confusion, hallucinations, sleep disturbance and suicidal ideas.   All other systems reviewed and are negative.       Objective:  BP 102/66   Pulse 76   Temp 97.8 F (36.6 C) (Temporal)   Resp 20   Ht 5\' 4"  (1.626 m)   Wt 197 lb (89.4 kg)   SpO2 96%   BMI 33.81 kg/m    Wt Readings from Last 3 Encounters:  02/27/23 197 lb (89.4 kg)  04/11/22 191 lb (86.6 kg)  11/29/21 189 lb (85.7 kg)    Physical Exam Vitals and nursing note reviewed.  Constitutional:      General: She is not in acute distress.    Appearance: Normal appearance. She is well-developed and well-groomed. She is obese. She is not ill-appearing, toxic-appearing or diaphoretic.  HENT:     Head: Normocephalic and atraumatic.     Jaw: There is normal jaw occlusion.     Right Ear: Hearing normal.     Left Ear: Hearing normal.     Nose: Nose normal.     Mouth/Throat:     Lips: Pink.     Mouth: Mucous membranes are moist.     Pharynx: Oropharynx is clear. Uvula midline.  Eyes:     General: Lids are normal.     Extraocular Movements: Extraocular movements intact.     Conjunctiva/sclera: Conjunctivae normal.     Pupils: Pupils are equal, round, and  reactive to light.  Neck:     Thyroid: No thyroid mass, thyromegaly or thyroid tenderness.     Vascular: No carotid bruit or JVD.     Trachea: Trachea and phonation normal.  Cardiovascular:     Rate and Rhythm: Normal rate and regular rhythm.     Chest Wall: PMI is not displaced.     Pulses: Normal pulses.     Heart sounds: Normal heart sounds. No murmur heard.    No friction rub. No gallop.  Pulmonary:     Effort: Pulmonary effort is normal. No respiratory distress.     Breath sounds: Normal breath sounds. No wheezing.  Abdominal:     General: Bowel sounds are normal. There is no distension or abdominal bruit.     Palpations: Abdomen is soft. There is  no hepatomegaly or splenomegaly.     Tenderness: There is no abdominal tenderness. There is no right CVA tenderness or left CVA tenderness.     Hernia: No hernia is present.  Musculoskeletal:        General: Normal range of motion.     Cervical back: Normal range of motion and neck supple.     Right lower leg: No edema.     Left lower leg: No edema.  Lymphadenopathy:     Cervical: No cervical adenopathy.  Skin:    General: Skin is warm and dry.     Capillary Refill: Capillary refill takes less than 2 seconds.     Coloration: Skin is not cyanotic, jaundiced or pale.     Findings: No rash.  Neurological:     General: No focal deficit present.     Mental Status: She is alert and oriented to person, place, and time.     Sensory: Sensation is intact.     Motor: Motor function is intact.     Coordination: Coordination is intact.     Gait: Gait is intact.     Deep Tendon Reflexes: Reflexes are normal and symmetric.  Psychiatric:        Attention and Perception: Attention and perception normal.        Mood and Affect: Mood and affect normal.        Speech: Speech normal.        Behavior: Behavior normal. Behavior is cooperative.        Thought Content: Thought content normal.        Cognition and Memory: Cognition and memory  normal.        Judgment: Judgment normal.     Results for orders placed or performed in visit on 07/14/21  Anemia Profile B  Result Value Ref Range   Total Iron Binding Capacity 299 250 - 450 ug/dL   UIBC 409 811 - 914 ug/dL   Iron 81 27 - 782 ug/dL   Iron Saturation 27 15 - 55 %   Ferritin 60 15 - 150 ng/mL   Vitamin B-12 478 232 - 1,245 pg/mL   Folate 14.8 >3.0 ng/mL   WBC 7.4 3.4 - 10.8 x10E3/uL   RBC 4.66 3.77 - 5.28 x10E6/uL   Hemoglobin 13.0 11.1 - 15.9 g/dL   Hematocrit 95.6 21.3 - 46.6 %   MCV 86 79 - 97 fL   MCH 27.9 26.6 - 33.0 pg   MCHC 32.4 31.5 - 35.7 g/dL   RDW 08.6 57.8 - 46.9 %   Platelets 335 150 - 450 x10E3/uL   Neutrophils 56 Not Estab. %   Lymphs 37 Not Estab. %   Monocytes 6 Not Estab. %   Eos 1 Not Estab. %   Basos 0 Not Estab. %   Neutrophils Absolute 4.1 1.4 - 7.0 x10E3/uL   Lymphocytes Absolute 2.7 0.7 - 3.1 x10E3/uL   Monocytes Absolute 0.4 0.1 - 0.9 x10E3/uL   EOS (ABSOLUTE) 0.1 0.0 - 0.4 x10E3/uL   Basophils Absolute 0.0 0.0 - 0.2 x10E3/uL   Immature Granulocytes 0 Not Estab. %   Immature Grans (Abs) 0.0 0.0 - 0.1 x10E3/uL   Retic Ct Pct 1.1 0.6 - 2.6 %  CMP14+EGFR  Result Value Ref Range   Glucose 83 70 - 99 mg/dL   BUN 12 6 - 20 mg/dL   Creatinine, Ser 6.29 0.57 - 1.00 mg/dL   eGFR 528 >41 LK/GMW/1.02   BUN/Creatinine Ratio 16 9 -  23   Sodium 139 134 - 144 mmol/L   Potassium 4.3 3.5 - 5.2 mmol/L   Chloride 101 96 - 106 mmol/L   CO2 23 20 - 29 mmol/L   Calcium 9.5 8.7 - 10.2 mg/dL   Total Protein 6.7 6.0 - 8.5 g/dL   Albumin 4.4 3.8 - 4.8 g/dL   Globulin, Total 2.3 1.5 - 4.5 g/dL   Albumin/Globulin Ratio 1.9 1.2 - 2.2   Bilirubin Total 0.5 0.0 - 1.2 mg/dL   Alkaline Phosphatase 68 44 - 121 IU/L   AST 18 0 - 40 IU/L   ALT 14 0 - 32 IU/L       Pertinent labs & imaging results that were available during my care of the patient were reviewed by me and considered in my medical decision making.  Assessment & Plan:  Carmon was seen  today for medical management of chronic issues.  Diagnoses and all orders for this visit:  Gastroesophageal reflux disease without esophagitis No red flags present. Diet discussed. Avoid fried, spicy, fatty, greasy, and acidic foods. Avoid caffeine, nicotine, and alcohol. Do not eat 2-3 hours before bedtime and stay upright for at least 1-2 hours after eating. Eat small frequent meals. Avoid NSAID's like motrin and aleve. Medications as prescribed. Report any new or worsening symptoms. Follow up as discussed or sooner if needed.   -     CBC with Differential/Platelet -     omeprazole (PRILOSEC) 20 MG capsule; Take 1 capsule (20 mg total) by mouth at bedtime. (NEEDS TO BE SEEN BEFORE NEXT REFILL)  Vitamin D deficiency Labs pending. Continue repletion therapy. If indicated, will change repletion dosage. Eat foods rich in Vit D including milk, orange juice, yogurt with vitamin D added, salmon or mackerel, canned tuna fish, cereals with vitamin D added, and cod liver oil. Get out in the sun but make sure to wear at least SPF 30 sunscreen.  -     CMP14+EGFR -     VITAMIN D 25 Hydroxy (Vit-D Deficiency, Fractures) -     Vitamin B12  BMI 33.0-33.9,adult Diet and exercise encouraged. Labs pending.  -     CBC with Differential/Platelet -     CMP14+EGFR -     Lipid panel -     Thyroid Panel With TSH -     VITAMIN D 25 Hydroxy (Vit-D Deficiency, Fractures) -     Vitamin B12  Mild intermittent asthma without complication Well controlled. No changes in current regimen.   Malaise and fatigue Ongoing for several months. Will check below for potential underlying causes.  -     CBC with Differential/Platelet -     CMP14+EGFR -     Thyroid Panel With TSH -     VITAMIN D 25 Hydroxy (Vit-D Deficiency, Fractures) -     Vitamin B12     Continue all other maintenance medications.  Follow up plan: Return in about 1 year (around 02/27/2024), or if symptoms worsen or fail to improve, for  CPE.   Continue healthy lifestyle choices, including diet (rich in fruits, vegetables, and lean proteins, and low in salt and simple carbohydrates) and exercise (at least 30 minutes of moderate physical activity daily).   The above assessment and management plan was discussed with the patient. The patient verbalized understanding of and has agreed to the management plan. Patient is aware to call the clinic if they develop any new symptoms or if symptoms persist or worsen. Patient is aware when to  return to the clinic for a follow-up visit. Patient educated on when it is appropriate to go to the emergency department.   Kari Baars, FNP-C [facility] [phone]

## 2023-02-28 LAB — CMP14+EGFR
ALT: 14 IU/L (ref 0–32)
AST: 13 IU/L (ref 0–40)
Albumin/Globulin Ratio: 1.9 (ref 1.2–2.2)
Albumin: 4.4 g/dL (ref 3.9–4.9)
Creatinine, Ser: 0.77 mg/dL (ref 0.57–1.00)
Potassium: 4.2 mmol/L (ref 3.5–5.2)
Sodium: 139 mmol/L (ref 134–144)
Total Protein: 6.7 g/dL (ref 6.0–8.5)

## 2023-02-28 LAB — LIPID PANEL
Chol/HDL Ratio: 3.6 ratio (ref 0.0–4.4)
VLDL Cholesterol Cal: 11 mg/dL (ref 5–40)

## 2023-02-28 LAB — THYROID PANEL WITH TSH
T3 Uptake Ratio: 28 % (ref 24–39)
TSH: 3.13 u[IU]/mL (ref 0.450–4.500)

## 2023-02-28 LAB — VITAMIN B12: Vitamin B-12: 486 pg/mL (ref 232–1245)

## 2023-02-28 LAB — CBC WITH DIFFERENTIAL/PLATELET
Basos: 1 %
Immature Grans (Abs): 0 10*3/uL (ref 0.0–0.1)
MCHC: 32.2 g/dL (ref 31.5–35.7)
MCV: 88 fL (ref 79–97)
Neutrophils: 54 %
WBC: 8.5 10*3/uL (ref 3.4–10.8)

## 2023-08-10 ENCOUNTER — Ambulatory Visit (INDEPENDENT_AMBULATORY_CARE_PROVIDER_SITE_OTHER): Payer: Commercial Managed Care - PPO | Admitting: Family Medicine

## 2023-08-10 ENCOUNTER — Encounter: Payer: Self-pay | Admitting: Family Medicine

## 2023-08-10 VITALS — BP 111/64 | HR 82 | Temp 97.8°F | Ht 64.0 in | Wt 198.0 lb

## 2023-08-10 DIAGNOSIS — J0140 Acute pansinusitis, unspecified: Secondary | ICD-10-CM | POA: Diagnosis not present

## 2023-08-10 MED ORDER — AMOXICILLIN-POT CLAVULANATE 875-125 MG PO TABS: 1.0000 | ORAL_TABLET | Freq: Two times a day (BID) | ORAL | 0 refills | Status: AC

## 2023-08-10 MED ORDER — CHLORPHEN-PE-ACETAMINOPHEN 4-10-325 MG PO TABS: 1.0000 | ORAL_TABLET | Freq: Four times a day (QID) | ORAL | 0 refills | Status: DC | PRN

## 2023-08-10 NOTE — Progress Notes (Signed)
Acute Office Visit  Subjective:     [name], female    DOB: 01/01/1986, 37 y.o.   MRN: [id number]  Chief Complaint  Patient presents with   Nasal Congestion    Cough This is a new problem. Episode onset: 2 weeks. Progression since onset: got better and then worse again. The cough is Non-productive. Associated symptoms include chills, ear congestion, ear pain, headaches and nasal congestion. Pertinent negatives include no chest pain, fever, sore throat, shortness of breath or wheezing. She has tried OTC cough suppressant for the symptoms. The treatment provided mild relief. Her past medical history is significant for asthma.   Hasn't had to use her albuterol inhaler recently.   Review of Systems  Constitutional:  Positive for chills. Negative for fever.  HENT:  Positive for ear pain. Negative for sore throat.   Respiratory:  Positive for cough. Negative for shortness of breath and wheezing.   Cardiovascular:  Negative for chest pain.  Neurological:  Positive for headaches.        Objective:    BP 111/64   Pulse 82   Temp 97.8 F (36.6 C) (Temporal)   Ht 5\' 4"  (1.626 m)   Wt 198 lb (89.8 kg)   SpO2 98%   BMI 33.99 kg/m    Physical Exam Vitals and nursing note reviewed.  Constitutional:      General: She is not in acute distress.    Appearance: She is not ill-appearing, toxic-appearing or diaphoretic.  HENT:     Head: Normocephalic and atraumatic.     Right Ear: A middle ear effusion is present. Tympanic membrane is bulging. Tympanic membrane is not erythematous.     Left Ear: A middle ear effusion is present. Tympanic membrane is not erythematous or bulging.     Nose: Congestion present.     Right Sinus: Maxillary sinus tenderness and frontal sinus tenderness present.     Left Sinus: Maxillary sinus tenderness and frontal sinus tenderness present.     Mouth/Throat:     Mouth: Mucous membranes are moist.     Pharynx: Oropharynx is clear.  Uvula midline. No pharyngeal swelling, oropharyngeal exudate or posterior oropharyngeal erythema.     Tonsils: No tonsillar exudate. 1+ on the right. 1+ on the left.  Eyes:     General:        Right eye: No discharge.        Left eye: No discharge.     Conjunctiva/sclera: Conjunctivae normal.  Cardiovascular:     Rate and Rhythm: Normal rate and regular rhythm.     Heart sounds: No murmur heard. Pulmonary:     Effort: Pulmonary effort is normal.     Breath sounds: Normal breath sounds.  Musculoskeletal:     Cervical back: Neck supple. No rigidity.     Right lower leg: No edema.     Left lower leg: No edema.  Lymphadenopathy:     Cervical: Cervical adenopathy present.  Skin:    General: Skin is warm and dry.  Neurological:     General: No focal deficit present.     Mental Status: She is alert and oriented to person, place, and time.  Psychiatric:        Mood and Affect: Mood normal.        Behavior: Behavior normal.     No results found for any visits on 08/10/23.      Assessment & Plan:   Malasia was seen today for  nasal congestion.  Diagnoses and all orders for this visit:  Acute non-recurrent pansinusitis Augmentin as below. Noral AD prn, do not take other products with tylenol. Return to office for new or worsening symptoms, or if symptoms persist.  -     amoxicillin-clavulanate (AUGMENTIN) 875-125 MG tablet; Take 1 tablet by mouth 2 (two) times daily for 7 days. -     Chlorphen-PE-Acetaminophen 4-10-325 MG TABS; Take 1 tablet by mouth every 6 (six) hours as needed.  The patient indicates understanding of these issues and agrees with the plan.  Gabriel Earing, FNP

## 2023-09-26 NOTE — L&D Delivery Note (Signed)
 DELIVERY NOTE  Pt complete and at +2 station with urge to push. Epidural controlling pain. Pt pushed and delivered a viable female infant in LOA position. Anterior and posterior shoulders spontaneously delivered with next two pushes; body easily followed next. Infant placed on mothers abdomen and bulb suction of mouth and nose performed. Cord was then clamped and cut by FOB. Cord blood obtained, 3VC. Baby had a vigorous spontaneous cry noted. Placenta then delivered at 1428 intact. Fundal massage performed and pitocin  per protocol. Fundus firm. The following lacerations were noted: 1st deg. Repaired in routine fashion with 2-0 vicryl, EBL 100cc. Mother and baby stable. Counts correct   Infant time: 1424 Gender: female Placenta time: 1428 Apgars: 9/9 Weight: pending skin-to-skin

## 2023-10-25 ENCOUNTER — Encounter: Payer: Self-pay | Admitting: Family Medicine

## 2023-10-25 ENCOUNTER — Ambulatory Visit (INDEPENDENT_AMBULATORY_CARE_PROVIDER_SITE_OTHER): Payer: Commercial Managed Care - PPO | Admitting: Family Medicine

## 2023-10-25 VITALS — BP 114/68 | HR 100 | Temp 97.9°F | Ht 64.0 in | Wt 197.0 lb

## 2023-10-25 DIAGNOSIS — N926 Irregular menstruation, unspecified: Secondary | ICD-10-CM | POA: Diagnosis not present

## 2023-10-25 DIAGNOSIS — J069 Acute upper respiratory infection, unspecified: Secondary | ICD-10-CM | POA: Diagnosis not present

## 2023-10-25 DIAGNOSIS — R0989 Other specified symptoms and signs involving the circulatory and respiratory systems: Secondary | ICD-10-CM | POA: Diagnosis not present

## 2023-10-25 DIAGNOSIS — R509 Fever, unspecified: Secondary | ICD-10-CM | POA: Diagnosis not present

## 2023-10-25 LAB — PREGNANCY, URINE: Preg Test, Ur: NEGATIVE

## 2023-10-25 NOTE — Progress Notes (Signed)
Subjective:  [name], female    DOB: 1986/06/14, 38 y.o.   MRN: [id number]  Patient Care Team: Sonny Masters, FNP as PCP - General (Family Medicine)   Chief Complaint:  Fever (Had fever Monday and chills. Has had chills since then), Wheezing (Feels like chest is rattling ), and Ear Pain   HPI: Alyssa [name] is a 38 y.o. female presenting on 10/25/2023 for Fever (Had fever Monday and chills. Has had chills since then), Wheezing (Feels like chest is rattling ), and Ear Pain   Discussed the use of AI scribe software for clinical note transcription with the patient, who gave verbal consent to proceed.  History of Present Illness   The patient presents with respiratory symptoms including fever, chills, and chest congestion.  She began feeling unwell on Monday with cold chills and developed a fever that lasted through Monday night, breaking on Tuesday morning. Since then, she has continued to experience cold chills. This morning, her symptoms seemed to be settling in her chest, accompanied by a cough producing thick mucus, although she is unable to fully expectorate it. She hears a rattling sound in her chest and has no runny nose. Her ears hurt slightly, but she has not experienced a fever since Monday.  She has not used her albuterol inhaler recently, although she feels she might need it. She has not taken Mucinex or other medications to thin the mucus due to the possibility of being pregnant, as she and her husband started trying to conceive this month. Her last menstrual cycle was expected to start this weekend, but it has not yet begun, and she has not taken a pregnancy test.          Relevant past medical, surgical, family, and social history reviewed and updated as indicated.  Allergies and medications reviewed and updated. Data reviewed: Chart in Epic.   Past Medical History:  Diagnosis Date   Acne    Anxiety    Asthma    onset as a baby    Cholestasis    Hepatitis A    age 59, in second grade   SVD (spontaneous vaginal delivery) 10/10/2017    Past Surgical History:  Procedure Laterality Date   WISDOM TOOTH EXTRACTION      Social History   Socioeconomic History   Marital status: Married    Spouse name: Josh   Number of children: 1   Years of education: Not on file   Highest education level: Not on file  Occupational History   Occupation: stay at home mom  Tobacco Use   Smoking status: Never   Smokeless tobacco: Never  Vaping Use   Vaping status: Never Used  Substance and Sexual Activity   Alcohol use: No   Drug use: No   Sexual activity: Not on file  Other Topics Concern   Not on file  Social History Narrative   Not on file   Social Drivers of Health   Financial Resource Strain: Not on file  Food Insecurity: Not on file  Transportation Needs: Not on file  Physical Activity: Not on file  Stress: Not on file  Social Connections: Not on file  Intimate Partner Violence: Not on file    Outpatient Encounter Medications as of 10/25/2023  Medication Sig   albuterol (VENTOLIN HFA) 108 (90 Base) MCG/ACT inhaler Inhale 2 puffs into the lungs every 6 (six) hours as needed for wheezing or shortness of breath.  Chlorphen-PE-Acetaminophen 4-10-325 MG TABS Take 1 tablet by mouth every 6 (six) hours as needed.   folic acid (FOLVITE) 1 MG tablet Take 1 mg by mouth daily.   Multiple Vitamin (MULTI-VITAMIN DAILY PO) Multi Vitamin   omeprazole (PRILOSEC) 20 MG capsule Take 1 capsule (20 mg total) by mouth at bedtime. (NEEDS TO BE SEEN BEFORE NEXT REFILL)   VITAMIN D PO Take by mouth.   No facility-administered encounter medications on file as of 10/25/2023.    Allergies  Allergen Reactions   Sulfa Antibiotics     Rash   Elemental Sulfur Rash   Sulfur Rash    Pertinent ROS per HPI, otherwise unremarkable      Objective:  BP 114/68   Pulse 100   Temp 97.9 F (36.6 C) (Temporal)   Ht 5\' 4"  (1.626 m)    Wt 197 lb (89.4 kg)   SpO2 100%   BMI 33.81 kg/m    Wt Readings from Last 3 Encounters:  10/25/23 197 lb (89.4 kg)  08/10/23 198 lb (89.8 kg)  02/27/23 197 lb (89.4 kg)    Physical Exam Vitals and nursing note reviewed.  Constitutional:      General: She is not in acute distress.    Appearance: Normal appearance. She is obese. She is not ill-appearing, toxic-appearing or diaphoretic.  HENT:     Head: Normocephalic and atraumatic.     Right Ear: A middle ear effusion is present. Tympanic membrane is not erythematous.     Left Ear: A middle ear effusion is present. Tympanic membrane is not erythematous.     Nose: Nose normal.     Right Turbinates: Swollen.     Left Turbinates: Swollen.     Mouth/Throat:     Lips: Pink.     Mouth: Mucous membranes are moist.     Pharynx: Oropharynx is clear. Postnasal drip present. No oropharyngeal exudate or posterior oropharyngeal erythema.  Eyes:     Conjunctiva/sclera: Conjunctivae normal.     Pupils: Pupils are equal, round, and reactive to light.  Cardiovascular:     Rate and Rhythm: Normal rate and regular rhythm.     Heart sounds: Normal heart sounds.  Pulmonary:     Effort: Pulmonary effort is normal.     Breath sounds: Normal breath sounds.  Musculoskeletal:     Cervical back: Normal range of motion and neck supple.  Skin:    General: Skin is warm and dry.     Capillary Refill: Capillary refill takes less than 2 seconds.  Neurological:     General: No focal deficit present.     Mental Status: She is alert and oriented to person, place, and time.  Psychiatric:        Mood and Affect: Mood normal.        Behavior: Behavior normal.        Thought Content: Thought content normal.        Judgment: Judgment normal.      Results for orders placed or performed in visit on 02/27/23  CBC with Differential/Platelet   Collection Time: 02/27/23  8:48 AM  Result Value Ref Range   WBC 8.5 3.4 - 10.8 x10E3/uL   RBC 4.42 3.77 - 5.28  x10E6/uL   Hemoglobin 12.5 11.1 - 15.9 g/dL   Hematocrit 40.9 81.1 - 46.6 %   MCV 88 79 - 97 fL   MCH 28.3 26.6 - 33.0 pg   MCHC 32.2 31.5 - 35.7 g/dL   RDW 12.9  11.7 - 15.4 %   Platelets 320 150 - 450 x10E3/uL   Neutrophils 54 Not Estab. %   Lymphs 38 Not Estab. %   Monocytes 5 Not Estab. %   Eos 2 Not Estab. %   Basos 1 Not Estab. %   Neutrophils Absolute 4.6 1.4 - 7.0 x10E3/uL   Lymphocytes Absolute 3.3 (H) 0.7 - 3.1 x10E3/uL   Monocytes Absolute 0.4 0.1 - 0.9 x10E3/uL   EOS (ABSOLUTE) 0.2 0.0 - 0.4 x10E3/uL   Basophils Absolute 0.0 0.0 - 0.2 x10E3/uL   Immature Granulocytes 0 Not Estab. %   Immature Grans (Abs) 0.0 0.0 - 0.1 x10E3/uL  CMP14+EGFR   Collection Time: 02/27/23  8:48 AM  Result Value Ref Range   Glucose 93 70 - 99 mg/dL   BUN 10 6 - 20 mg/dL   Creatinine, Ser 3.29 0.57 - 1.00 mg/dL   eGFR 518 >84 ZY/SAY/3.01   BUN/Creatinine Ratio 13 9 - 23   Sodium 139 134 - 144 mmol/L   Potassium 4.2 3.5 - 5.2 mmol/L   Chloride 103 96 - 106 mmol/L   CO2 25 20 - 29 mmol/L   Calcium 9.7 8.7 - 10.2 mg/dL   Total Protein 6.7 6.0 - 8.5 g/dL   Albumin 4.4 3.9 - 4.9 g/dL   Globulin, Total 2.3 1.5 - 4.5 g/dL   Albumin/Globulin Ratio 1.9 1.2 - 2.2   Bilirubin Total 0.3 0.0 - 1.2 mg/dL   Alkaline Phosphatase 67 44 - 121 IU/L   AST 13 0 - 40 IU/L   ALT 14 0 - 32 IU/L  Lipid panel   Collection Time: 02/27/23  8:48 AM  Result Value Ref Range   Cholesterol, Total 215 (H) 100 - 199 mg/dL   Triglycerides 61 0 - 149 mg/dL   HDL 60 >60 mg/dL   VLDL Cholesterol Cal 11 5 - 40 mg/dL   LDL Chol Calc (NIH) 109 (H) 0 - 99 mg/dL   Chol/HDL Ratio 3.6 0.0 - 4.4 ratio  Thyroid Panel With TSH   Collection Time: 02/27/23  8:48 AM  Result Value Ref Range   TSH 3.130 0.450 - 4.500 uIU/mL   T4, Total 9.4 4.5 - 12.0 ug/dL   T3 Uptake Ratio 28 24 - 39 %   Free Thyroxine Index 2.6 1.2 - 4.9  VITAMIN D 25 Hydroxy (Vit-D Deficiency, Fractures)   Collection Time: 02/27/23  8:48 AM  Result Value  Ref Range   Vit D, 25-Hydroxy 25.4 (L) 30.0 - 100.0 ng/mL  Vitamin B12   Collection Time: 02/27/23  8:48 AM  Result Value Ref Range   Vitamin B-12 486 232 - 1,245 pg/mL       Pertinent labs & imaging results that were available during my care of the patient were reviewed by me and considered in my medical decision making.  Assessment & Plan:  Tequila was seen today for fever, wheezing and ear pain.  Diagnoses and all orders for this visit:  Fever and chills Chest congestion URI with cough and congestion Missed menses -     Pregnancy, urine   Upper Respiratory Infection Acute onset of symptoms starting Monday with chills, fever, and chest congestion. Symptoms include thick mucus production, chest rattling, and ear discomfort. No runny nose or significant fever since Monday. Physical exam reveals fluid behind ears but no wheezing or signs of strep throat. Likely viral etiology, possibly influenza or another upper respiratory virus. Discussed symptomatic care with emphasis on safe medications during potential pregnancy. -  Recommend Tylenol for fever and pain control - Encourage increased water intake - Provide list of pregnancy-safe medications (Zyrtec, Benadryl, Flonase, plain Mucinex) - Perform urine pregnancy test  Possible Pregnancy Reports potential pregnancy with last menstrual cycle expected this weekend. Patient and spouse have been trying to conceive this month. No pregnancy test has been taken yet. Discussed the importance of confirming pregnancy to guide safe medication use. - Perform urine pregnancy test - Advise on safe medications during potential pregnancy (Tylenol, Zyrtec, Benadryl, Flonase, plain Mucinex)  Asthma Asthma with no current wheezing noted on exam. Advised to use albuterol inhaler as needed. - Advise use of albuterol inhaler as needed  General Health Maintenance Discussed general health maintenance and safe medication use during potential pregnancy. -  Encourage increased water intake - Provide list of pregnancy-safe medications for cold symptom management  Follow-up - Perform urine pregnancy test - Advise follow-up if symptoms worsen or persist.          Continue all other maintenance medications.  Follow up plan: Return if symptoms worsen or fail to improve.   Continue healthy lifestyle choices, including diet (rich in fruits, vegetables, and lean proteins, and low in salt and simple carbohydrates) and exercise (at least 30 minutes of moderate physical activity daily).   The above assessment and management plan was discussed with the patient. The patient verbalized understanding of and has agreed to the management plan. Patient is aware to call the clinic if they develop any new symptoms or if symptoms persist or worsen. Patient is aware when to return to the clinic for a follow-up visit. Patient educated on when it is appropriate to go to the emergency department.   Kari Baars, FNP-C [facility] [phone]

## 2023-11-26 LAB — HEPATITIS C ANTIBODY: HCV Ab: NEGATIVE

## 2023-11-26 LAB — OB RESULTS CONSOLE HEPATITIS B SURFACE ANTIGEN: Hepatitis B Surface Ag: NEGATIVE

## 2023-11-26 LAB — OB RESULTS CONSOLE HIV ANTIBODY (ROUTINE TESTING): HIV: NONREACTIVE

## 2023-11-26 LAB — OB RESULTS CONSOLE RPR: RPR: NONREACTIVE

## 2023-11-26 LAB — OB RESULTS CONSOLE GC/CHLAMYDIA
Chlamydia: NEGATIVE
Neisseria Gonorrhea: NEGATIVE

## 2023-11-26 LAB — OB RESULTS CONSOLE RUBELLA ANTIBODY, IGM: Rubella: NON-IMMUNE/NOT IMMUNE

## 2023-11-26 LAB — OB RESULTS CONSOLE ANTIBODY SCREEN: Antibody Screen: NEGATIVE

## 2024-01-23 ENCOUNTER — Other Ambulatory Visit: Payer: Self-pay | Admitting: Obstetrics and Gynecology

## 2024-01-23 DIAGNOSIS — K8020 Calculus of gallbladder without cholecystitis without obstruction: Secondary | ICD-10-CM

## 2024-01-29 ENCOUNTER — Ambulatory Visit
Admission: RE | Admit: 2024-01-29 | Discharge: 2024-01-29 | Disposition: A | Discharge status: Home / Self Care | Source: Ambulatory Visit | Attending: Obstetrics and Gynecology | Admitting: Obstetrics and Gynecology

## 2024-01-29 DIAGNOSIS — K8020 Calculus of gallbladder without cholecystitis without obstruction: Secondary | ICD-10-CM

## 2024-02-28 ENCOUNTER — Encounter: Payer: Commercial Managed Care - PPO | Admitting: Family Medicine

## 2024-02-28 ENCOUNTER — Encounter: Payer: Self-pay | Admitting: Family Medicine

## 2024-02-28 DIAGNOSIS — K219 Gastro-esophageal reflux disease without esophagitis: Secondary | ICD-10-CM

## 2024-02-28 DIAGNOSIS — E559 Vitamin D deficiency, unspecified: Secondary | ICD-10-CM

## 2024-02-28 DIAGNOSIS — Z0000 Encounter for general adult medical examination without abnormal findings: Secondary | ICD-10-CM

## 2024-03-19 DIAGNOSIS — O4400 Placenta previa specified as without hemorrhage, unspecified trimester: Secondary | ICD-10-CM | POA: Insufficient documentation

## 2024-03-22 ENCOUNTER — Other Ambulatory Visit: Payer: Self-pay | Admitting: Family Medicine

## 2024-03-22 DIAGNOSIS — K219 Gastro-esophageal reflux disease without esophagitis: Secondary | ICD-10-CM

## 2024-03-24 NOTE — Telephone Encounter (Signed)
 Rosaline NTBS last chronic FU 02/27/23 NO RF sent to pharmacy last OV greater than a year

## 2024-03-24 NOTE — Addendum Note (Signed)
 Addended by: Dajon Lazar D on: 03/24/2024 11:12 AM   Modules accepted: Orders

## 2024-03-24 NOTE — Telephone Encounter (Signed)
 Appt scheduled for 04/08/2024, please send refill to pharmacy

## 2024-03-25 MED ORDER — OMEPRAZOLE 20 MG PO CPDR: 20.0000 mg | DELAYED_RELEASE_CAPSULE | Freq: Every day | ORAL | 0 refills | Status: DC

## 2024-04-06 DIAGNOSIS — Z3A24 24 weeks gestation of pregnancy: Secondary | ICD-10-CM | POA: Insufficient documentation

## 2024-04-08 ENCOUNTER — Encounter: Payer: Self-pay | Admitting: Family Medicine

## 2024-04-08 ENCOUNTER — Ambulatory Visit: Admitting: Family Medicine

## 2024-04-08 VITALS — BP 126/81 | HR 114 | Temp 97.9°F | Ht 64.0 in | Wt 218.4 lb

## 2024-04-08 DIAGNOSIS — Z3A27 27 weeks gestation of pregnancy: Secondary | ICD-10-CM

## 2024-04-08 DIAGNOSIS — E559 Vitamin D deficiency, unspecified: Secondary | ICD-10-CM | POA: Diagnosis not present

## 2024-04-08 DIAGNOSIS — E7800 Pure hypercholesterolemia, unspecified: Secondary | ICD-10-CM | POA: Diagnosis not present

## 2024-04-08 DIAGNOSIS — K219 Gastro-esophageal reflux disease without esophagitis: Secondary | ICD-10-CM | POA: Diagnosis not present

## 2024-04-08 DIAGNOSIS — D229 Melanocytic nevi, unspecified: Secondary | ICD-10-CM

## 2024-04-08 DIAGNOSIS — O4402 Placenta previa specified as without hemorrhage, second trimester: Secondary | ICD-10-CM

## 2024-04-08 MED ORDER — OMEPRAZOLE 20 MG PO CPDR: 20.0000 mg | DELAYED_RELEASE_CAPSULE | Freq: Every day | ORAL | 1 refills | Status: AC

## 2024-04-08 NOTE — Progress Notes (Signed)
 Subjective:  [name], female    DOB: [birth year], 38 y.o.   MRN: [id number]  Patient Care Team: Severa Rock HERO, FNP as PCP - General (Family Medicine)   Chief Complaint:  Medical Management of Chronic Issues   HPI: Alyssa [name] is a 38 y.o. female presenting on 04/08/2024 for Medical Management of Chronic Issues   The patient, with a high-risk pregnancy, presents for follow-up.  She is currently [redacted] weeks pregnant and experiencing placenta previa, which has led to a scheduled C-section on September 19th. There is hope that the condition may resolve on its own.  She experiences significant heartburn and reflux, which have been exacerbated during the pregnancy. She is currently taking omeprazole  20 mg at bedtime but experiences xerostomia as a side effect, requiring her to drink water frequently at night. She wants to discontinue the medication post-pregnancy.  She has noticed swelling in her legs and persistent numbness in her right arm. She experienced nausea and vomiting during the first trimester, but these symptoms have since resolved, allowing her to eat normally.  She has not had her blood drawn since March and is scheduled for a glucose test on July 28th. She is concerned about her vitamin D  levels, as they have not been checked recently. She has a history of elevated cholesterol, which was noted in previous labs.  She is interested in having some moles and skin tags removed from her neck and face, which she attributes to pregnancy changes. She has previously seen a dermatologist for acne but has not been in years.          Relevant past medical, surgical, family, and social history reviewed and updated as indicated.  Allergies and medications reviewed and updated. Data reviewed: Chart in Epic.   Past Medical History:  Diagnosis Date   Acne    Anxiety    Asthma    onset as a baby   Cholestasis    Hepatitis A    age 59, in second grade    SVD (spontaneous vaginal delivery) 10/10/2017    Past Surgical History:  Procedure Laterality Date   WISDOM TOOTH EXTRACTION      Social History   Socioeconomic History   Marital status: Married    Spouse name: Josh   Number of children: 1   Years of education: Not on file   Highest education level: Not on file  Occupational History   Occupation: stay at home mom  Tobacco Use   Smoking status: Never   Smokeless tobacco: Never  Vaping Use   Vaping status: Never Used  Substance and Sexual Activity   Alcohol use: No   Drug use: No   Sexual activity: Not on file  Other Topics Concern   Not on file  Social History Narrative   Not on file   Social Drivers of Health   Financial Resource Strain: Not on file  Food Insecurity: Not on file  Transportation Needs: Not on file  Physical Activity: Not on file  Stress: Not on file  Social Connections: Not on file  Intimate Partner Violence: Not on file    Outpatient Encounter Medications as of 04/08/2024  Medication Sig   albuterol  (VENTOLIN  HFA) 108 (90 Base) MCG/ACT inhaler Inhale 2 puffs into the lungs every 6 (six) hours as needed for wheezing or shortness of breath.   folic acid (FOLVITE) 1 MG tablet Take 1 mg by mouth daily.   Multiple Vitamin (MULTI-VITAMIN DAILY PO)  Multi Vitamin   VITAMIN D  PO Take by mouth.   [DISCONTINUED] Chlorphen-PE-Acetaminophen  4-10-325 MG TABS Take 1 tablet by mouth every 6 (six) hours as needed.   [DISCONTINUED] omeprazole  (PRILOSEC) 20 MG capsule Take 1 capsule (20 mg total) by mouth at bedtime. (NEEDS TO BE SEEN BEFORE NEXT REFILL)   omeprazole  (PRILOSEC) 20 MG capsule Take 1 capsule (20 mg total) by mouth at bedtime.   No facility-administered encounter medications on file as of 04/08/2024.    Allergies  Allergen Reactions   Sulfa Antibiotics     Rash   Elemental Sulfur Rash   Sulfur Rash    Pertinent ROS per HPI, otherwise unremarkable      Objective:  BP 126/81   Pulse (!)  114   Temp 97.9 F (36.6 C)   Ht 5' 4 (1.626 m)   Wt 218 lb 6.4 oz (99.1 kg)   SpO2 100%   BMI 37.49 kg/m    Wt Readings from Last 3 Encounters:  04/08/24 218 lb 6.4 oz (99.1 kg)  10/25/23 197 lb (89.4 kg)  08/10/23 198 lb (89.8 kg)    Physical Exam Vitals and nursing note reviewed.  Constitutional:      General: She is not in acute distress.    Appearance: Normal appearance. She is not ill-appearing or diaphoretic.  HENT:     Head: Normocephalic and atraumatic.     Nose: Nose normal.     Mouth/Throat:     Mouth: Mucous membranes are moist.  Eyes:     Conjunctiva/sclera: Conjunctivae normal.     Pupils: Pupils are equal, round, and reactive to light.  Cardiovascular:     Rate and Rhythm: Normal rate and regular rhythm.     Heart sounds: Normal heart sounds.  Pulmonary:     Effort: Pulmonary effort is normal.     Breath sounds: Normal breath sounds.  Musculoskeletal:     Right lower leg: No edema.     Left lower leg: No edema.  Skin:    General: Skin is warm and dry.     Capillary Refill: Capillary refill takes less than 2 seconds.     Findings: Lesion (brown dark colored mole to right temple, several skin tags to bilateral neck) present.  Neurological:     General: No focal deficit present.     Mental Status: She is alert and oriented to person, place, and time.  Psychiatric:        Mood and Affect: Mood normal.        Behavior: Behavior normal.        Thought Content: Thought content normal.        Judgment: Judgment normal.         Results for orders placed or performed in visit on 10/25/23  Pregnancy, urine   Collection Time: 10/25/23 11:58 AM  Result Value Ref Range   Preg Test, Ur Negative Negative       Pertinent labs & imaging results that were available during my care of the patient were reviewed by me and considered in my medical decision making.  Assessment & Plan:  Alyssa [name] was seen today for medical management of chronic issues.  Diagnoses  and all orders for this visit:  Gastroesophageal reflux disease without esophagitis -     omeprazole  (PRILOSEC) 20 MG capsule; Take 1 capsule (20 mg total) by mouth at bedtime.  Vitamin D  deficiency -     Vitamin D , 25-hydroxy -     CMP14+EGFR  [redacted] weeks gestation of pregnancy -     CBC with Differential/Platelet -     CMP14+EGFR -     Thyroid  Panel With TSH  Placenta previa in second trimester -     CBC with Differential/Platelet  Elevated cholesterol -     Lipid panel  Atypical mole -     Ambulatory referral to Dermatology        Placenta previa At [redacted] weeks gestation, she is diagnosed with placenta previa, classifying her as high risk. A C-section is scheduled for September 19th, with the possibility of the placenta moving, potentially avoiding the need for surgery. - Proceed with scheduled C-section if placenta does not move  Gastroesophageal reflux disease (GERD) Experiences significant heartburn and reflux during pregnancy, managed with omeprazole . Reports waking up with a dry mouth, likely a side effect of the medication. Wishes to discontinue omeprazole  post-pregnancy. OB is aware of omeprazole  use, which is not contraindicated in pregnancy but lacks significant study. - Continue omeprazole  during pregnancy - Recommend dry mouth mouthwash up to 3-4 times a day - Post-pregnancy, gradually taper off omeprazole  to avoid rebound reflux  Peripheral edema Reports swelling in her legs, a common occurrence during pregnancy.  Paresthesia of the right arm Reports persistent numbness in her right arm.  Skin tags Developed skin tags on her neck and face, which she finds bothersome. Previously consulted a dermatologist for acne but has not been in years. - Refer to dermatologist for removal of skin tags  General Health Maintenance Has not had recent blood work to check vitamin D  levels, blood counts, or cholesterol. Scheduled for a glucose test on July 28th. - Order blood  work to check vitamin D  levels, blood counts, and cholesterol          Continue all other maintenance medications.  Follow up plan: Return in about 6 months (around 10/09/2024), or if symptoms worsen or fail to improve, for Annual Physical.   Continue healthy lifestyle choices, including diet (rich in fruits, vegetables, and lean proteins, and low in salt and simple carbohydrates) and exercise (at least 30 minutes of moderate physical activity daily).    The above assessment and management plan was discussed with the patient. The patient verbalized understanding of and has agreed to the management plan. Patient is aware to call the clinic if they develop any new symptoms or if symptoms persist or worsen. Patient is aware when to return to the clinic for a follow-up visit. Patient educated on when it is appropriate to go to the emergency department.   Alyssa Bruns, FNP-C [facility] [phone]

## 2024-04-09 ENCOUNTER — Ambulatory Visit: Payer: Self-pay | Admitting: Family Medicine

## 2024-04-09 LAB — VITAMIN D 25 HYDROXY (VIT D DEFICIENCY, FRACTURES): Vit D, 25-Hydroxy: 29.6 ng/mL — ABNORMAL LOW (ref 30.0–100.0)

## 2024-04-09 LAB — CBC WITH DIFFERENTIAL/PLATELET
Basophils Absolute: 0.1 x10E3/uL (ref 0.0–0.2)
Basos: 0 %
EOS (ABSOLUTE): 0.1 x10E3/uL (ref 0.0–0.4)
Eos: 1 %
Hematocrit: 33.3 % — ABNORMAL LOW (ref 34.0–46.6)
Hemoglobin: 10.6 g/dL — ABNORMAL LOW (ref 11.1–15.9)
Immature Grans (Abs): 0.3 x10E3/uL — ABNORMAL HIGH (ref 0.0–0.1)
Immature Granulocytes: 2 %
Lymphocytes Absolute: 2.2 x10E3/uL (ref 0.7–3.1)
Lymphs: 14 %
MCH: 27.9 pg (ref 26.6–33.0)
MCHC: 31.8 g/dL (ref 31.5–35.7)
MCV: 88 fL (ref 79–97)
Monocytes Absolute: 0.6 x10E3/uL (ref 0.1–0.9)
Monocytes: 4 %
Neutrophils Absolute: 12.6 x10E3/uL — ABNORMAL HIGH (ref 1.4–7.0)
Neutrophils: 79 %
Platelets: 472 x10E3/uL — ABNORMAL HIGH (ref 150–450)
RBC: 3.8 x10E6/uL (ref 3.77–5.28)
RDW: 13.6 % (ref 11.7–15.4)
WBC: 15.8 x10E3/uL — ABNORMAL HIGH (ref 3.4–10.8)

## 2024-04-09 LAB — CMP14+EGFR
ALT: 39 IU/L — ABNORMAL HIGH (ref 0–32)
AST: 33 IU/L (ref 0–40)
Albumin: 3.4 g/dL — ABNORMAL LOW (ref 3.9–4.9)
Alkaline Phosphatase: 219 IU/L — ABNORMAL HIGH (ref 44–121)
BUN/Creatinine Ratio: 15 (ref 9–23)
BUN: 8 mg/dL (ref 6–20)
Bilirubin Total: <0.2 mg/dL (ref 0.0–1.2)
CO2: 18 mmol/L — ABNORMAL LOW (ref 20–29)
Calcium: 9.4 mg/dL (ref 8.7–10.2)
Chloride: 100 mmol/L (ref 96–106)
Creatinine, Ser: 0.55 mg/dL — ABNORMAL LOW (ref 0.57–1.00)
Globulin, Total: 2.6 g/dL (ref 1.5–4.5)
Glucose: 110 mg/dL — ABNORMAL HIGH (ref 70–99)
Potassium: 3.6 mmol/L (ref 3.5–5.2)
Sodium: 136 mmol/L (ref 134–144)
Total Protein: 6 g/dL (ref 6.0–8.5)
eGFR: 121 mL/min/1.73 (ref >59)

## 2024-04-09 LAB — THYROID PANEL WITH TSH
Free Thyroxine Index: 0.8 — ABNORMAL LOW (ref 1.2–4.9)
T3 Uptake Ratio: 8 % — ABNORMAL LOW (ref 24–39)
T4, Total: 9.4 ug/dL (ref 4.5–12.0)
TSH: 2.36 u[IU]/mL (ref 0.450–4.500)

## 2024-04-09 LAB — LIPID PANEL
Chol/HDL Ratio: 5.1 ratio — ABNORMAL HIGH (ref 0.0–4.4)
Cholesterol, Total: 302 mg/dL — ABNORMAL HIGH (ref 100–199)
HDL: 59 mg/dL (ref >39)
LDL Chol Calc (NIH): 197 mg/dL — ABNORMAL HIGH (ref 0–99)
Triglycerides: 239 mg/dL — ABNORMAL HIGH (ref 0–149)
VLDL Cholesterol Cal: 46 mg/dL — ABNORMAL HIGH (ref 5–40)

## 2024-04-14 ENCOUNTER — Encounter: Payer: Self-pay | Admitting: Family Medicine

## 2024-05-20 ENCOUNTER — Other Ambulatory Visit (HOSPITAL_COMMUNITY): Payer: Self-pay | Admitting: Physician Assistant

## 2024-05-20 ENCOUNTER — Telehealth (HOSPITAL_COMMUNITY): Payer: Self-pay | Admitting: Physician Assistant

## 2024-05-20 ENCOUNTER — Inpatient Hospital Stay: Attending: Physician Assistant | Admitting: Physician Assistant

## 2024-05-20 ENCOUNTER — Encounter: Payer: Self-pay | Admitting: Physician Assistant

## 2024-05-20 ENCOUNTER — Inpatient Hospital Stay

## 2024-05-20 ENCOUNTER — Telehealth: Payer: Self-pay | Admitting: Physician Assistant

## 2024-05-20 ENCOUNTER — Telehealth (HOSPITAL_COMMUNITY): Payer: Self-pay

## 2024-05-20 VITALS — BP 138/85 | HR 109 | Temp 97.7°F | Resp 16 | Ht 64.0 in | Wt 228.8 lb

## 2024-05-20 DIAGNOSIS — D72829 Elevated white blood cell count, unspecified: Secondary | ICD-10-CM

## 2024-05-20 DIAGNOSIS — Z3A34 34 weeks gestation of pregnancy: Secondary | ICD-10-CM | POA: Diagnosis not present

## 2024-05-20 DIAGNOSIS — D539 Nutritional anemia, unspecified: Secondary | ICD-10-CM | POA: Insufficient documentation

## 2024-05-20 DIAGNOSIS — O99113 Other diseases of the blood and blood-forming organs and certain disorders involving the immune mechanism complicating pregnancy, third trimester: Secondary | ICD-10-CM | POA: Insufficient documentation

## 2024-05-20 DIAGNOSIS — D509 Iron deficiency anemia, unspecified: Secondary | ICD-10-CM

## 2024-05-20 LAB — CMP (CANCER CENTER ONLY)
ALT: 113 U/L — ABNORMAL HIGH (ref 0–44)
AST: 80 U/L — ABNORMAL HIGH (ref 15–41)
Albumin: 3.2 g/dL — ABNORMAL LOW (ref 3.5–5.0)
Alkaline Phosphatase: 276 U/L — ABNORMAL HIGH (ref 38–126)
Anion gap: 8 (ref 5–15)
BUN: 8 mg/dL (ref 6–20)
CO2: 22 mmol/L (ref 22–32)
Calcium: 8.8 mg/dL — ABNORMAL LOW (ref 8.9–10.3)
Chloride: 105 mmol/L (ref 98–111)
Creatinine: 0.61 mg/dL (ref 0.44–1.00)
GFR, Estimated: >60 mL/min (ref >60)
Glucose, Bld: 121 mg/dL — ABNORMAL HIGH (ref 70–99)
Potassium: 3.4 mmol/L — ABNORMAL LOW (ref 3.5–5.1)
Sodium: 135 mmol/L (ref 135–145)
Total Bilirubin: 0.2 mg/dL (ref 0.0–1.2)
Total Protein: 6.4 g/dL — ABNORMAL LOW (ref 6.5–8.1)

## 2024-05-20 LAB — IRON AND IRON BINDING CAPACITY (CC-WL,HP ONLY)
Iron: 23 ug/dL — ABNORMAL LOW (ref 28–170)
Saturation Ratios: 3 % — ABNORMAL LOW (ref 10.4–31.8)
TIBC: 784 ug/dL — ABNORMAL HIGH (ref 250–450)
UIBC: 761 ug/dL — ABNORMAL HIGH (ref 148–442)

## 2024-05-20 LAB — CBC WITH DIFFERENTIAL (CANCER CENTER ONLY)
Abs Immature Granulocytes: 0.4 K/uL — ABNORMAL HIGH (ref 0.00–0.07)
Basophils Absolute: 0.1 K/uL (ref 0.0–0.1)
Basophils Relative: 1 %
Eosinophils Absolute: 0.2 K/uL (ref 0.0–0.5)
Eosinophils Relative: 1 %
HCT: 29.3 % — ABNORMAL LOW (ref 36.0–46.0)
Hemoglobin: 9.8 g/dL — ABNORMAL LOW (ref 12.0–15.0)
Immature Granulocytes: 3 %
Lymphocytes Relative: 16 %
Lymphs Abs: 2.4 K/uL (ref 0.7–4.0)
MCH: 26.5 pg (ref 26.0–34.0)
MCHC: 33.4 g/dL (ref 30.0–36.0)
MCV: 79.2 fL — ABNORMAL LOW (ref 80.0–100.0)
Monocytes Absolute: 0.7 K/uL (ref 0.1–1.0)
Monocytes Relative: 5 %
Neutro Abs: 11.3 K/uL — ABNORMAL HIGH (ref 1.7–7.7)
Neutrophils Relative %: 74 %
Platelet Count: 444 K/uL — ABNORMAL HIGH (ref 150–400)
RBC: 3.7 MIL/uL — ABNORMAL LOW (ref 3.87–5.11)
RDW: 14 % (ref 11.5–15.5)
WBC Count: 15 K/uL — ABNORMAL HIGH (ref 4.0–10.5)
nRBC: 0.2 % (ref 0.0–0.2)

## 2024-05-20 LAB — C-REACTIVE PROTEIN: CRP: 1.7 mg/dL — ABNORMAL HIGH (ref <1.0)

## 2024-05-20 LAB — TECHNOLOGIST SMEAR REVIEW: Plt Morphology: NORMAL

## 2024-05-20 LAB — FERRITIN: Ferritin: 15 ng/mL (ref 11–307)

## 2024-05-20 LAB — SEDIMENTATION RATE: Sed Rate: 102 mm/h — ABNORMAL HIGH (ref 0–22)

## 2024-05-20 NOTE — Telephone Encounter (Signed)
 Patient referred to infusion pharmacy team for ambulatory infusion of IV iron.  Insurance - [employer] Site of care - Site of care: [facility]F Dx code - D50.9 IV Iron Therapy - Feraheme 510 mg x 2 Infusion appointments - Scheduling team will schedule patient as soon as possible.   Thank you,  Norton Blush, PharmD, BCSCP [facility]

## 2024-05-20 NOTE — Progress Notes (Addendum)
 [facility] [phone]   [phone]  INITIAL CONSULT NOTE  Patient Care Team: Severa Rock HERO, FNP as PCP - General (Family Medicine)  CHIEF COMPLAINTS/PURPOSE OF CONSULTATION:  Leukocytosis during pregnancy  HISTORY OF PRESENTING ILLNESS:  Alyssa [name] 38 y.o. female with medical history significant for anxiety, cholestasis, childhood asthma and history of hepatitis A presents to the hematology clinic for evaluation of leukocytosis during pregnancy.   On review of the previous records, there is evidence of leukocytosis during her last pregnancy in January 2019 with WBC ranging between 14-16. The leukocytosis resolved back to normal after her delivery. The most recent labs from 04/08/2024 showed WBC 15.8, ANC 12.6.  On exam today, Alyssa [name] is reported expected fatigue during her pregnancy. She denies any infectious symptoms including fever, chills, cough, urinary symptoms. She has expected lower leg swelling and hands. She denies nausea, vomiting or bowel habit changes. She denies easy bruising or signs of bleeding. She denies any B symptoms including night sweats, unexpected weight loss, or fever. She has no other complaints. Rest of the ROS is below.   MEDICAL HISTORY:  Past Medical History:  Diagnosis Date   Acne    Anxiety    Asthma    onset as a baby   Cholestasis    Hepatitis A    age 16, in second grade   SVD (spontaneous vaginal delivery) 10/10/2017    SURGICAL HISTORY: Past Surgical History:  Procedure Laterality Date   WISDOM TOOTH EXTRACTION      SOCIAL HISTORY: Social History   Socioeconomic History   Marital status: Married    Spouse name: Josh   Number of children: 1   Years of education: Not on file   Highest education level: Not on file  Occupational History   Occupation: stay at home mom  Tobacco Use   Smoking status: Never   Smokeless tobacco: Never  Vaping Use   Vaping status: Never Used  Substance  and Sexual Activity   Alcohol use: No   Drug use: No   Sexual activity: Not on file  Other Topics Concern   Not on file  Social History Narrative   Not on file   Social Drivers of Health   Financial Resource Strain: Not on file  Food Insecurity: No Food Insecurity (05/20/2024)   Hunger Vital Sign    Worried About Running Out of Food in the Last Year: Never true    Ran Out of Food in the Last Year: Never true  Transportation Needs: No Transportation Needs (05/20/2024)   PRAPARE - Administrator, Civil Service (Medical): No    Lack of Transportation (Non-Medical): No  Physical Activity: Not on file  Stress: Not on file  Social Connections: Not on file  Intimate Partner Violence: Not At Risk (05/20/2024)   Humiliation, Afraid, Rape, and Kick questionnaire    Fear of Current or Ex-Partner: No    Emotionally Abused: No    Physically Abused: No    Sexually Abused: No    FAMILY HISTORY: Family History  Problem Relation Age of Onset   Healthy Mother    Colon polyps Mother    Healthy Father    Healthy Sister    Cancer Maternal Grandmother        Colorectal   Heart disease Paternal Grandmother    Heart disease Paternal Grandfather     ALLERGIES:  is allergic to sulfa antibiotics, elemental sulfur, and sulfur.  MEDICATIONS:  Current  Outpatient Medications  Medication Sig Dispense Refill   albuterol  (VENTOLIN  HFA) 108 (90 Base) MCG/ACT inhaler Inhale 2 puffs into the lungs every 6 (six) hours as needed for wheezing or shortness of breath. 8 g 2   folic acid (FOLVITE) 1 MG tablet Take 1 mg by mouth daily.     Multiple Vitamin (MULTI-VITAMIN DAILY PO) Multi Vitamin     omeprazole  (PRILOSEC) 20 MG capsule Take 1 capsule (20 mg total) by mouth at bedtime. 90 capsule 1   ursodiol  (ACTIGALL ) 300 MG capsule Take 300 mg by mouth 2 (two) times daily.     VITAMIN D  PO Take by mouth.     No current facility-administered medications for this visit.    REVIEW OF SYSTEMS:    Constitutional: ( - ) fevers, ( - )  chills , ( - ) night sweats Eyes: ( - ) blurriness of vision, ( - ) double vision, ( - ) watery eyes Ears, nose, mouth, throat, and face: ( - ) mucositis, ( - ) sore throat Respiratory: ( - ) cough, ( - ) dyspnea, ( - ) wheezes Cardiovascular: ( - ) palpitation, ( - ) chest discomfort, ( - ) lower extremity swelling Gastrointestinal:  ( - ) nausea, ( - ) heartburn, ( - ) change in bowel habits Skin: ( - ) abnormal skin rashes Lymphatics: ( - ) new lymphadenopathy, ( - ) easy bruising Neurological: ( - ) numbness, ( - ) tingling, ( - ) new weaknesses Behavioral/Psych: ( - ) mood change, ( - ) new changes  All other systems were reviewed with the patient and are negative.  PHYSICAL EXAMINATION: ECOG PERFORMANCE STATUS: 0 - Asymptomatic  Vitals:   05/20/24 0915  BP: 138/85  Pulse: (!) 109  Resp: 16  Temp: 97.7 F (36.5 C)  SpO2: 99%   Filed Weights   05/20/24 0915  Weight: 228 lb 12.8 oz (103.8 kg)    GENERAL: well appearing female in NAD  SKIN: skin color, texture, turgor are normal, no rashes or significant lesions EYES: conjunctiva are pink and non-injected, sclera clear NECK: supple, non-tender LYMPH:  no palpable lymphadenopathy in the cervical or supraclavicular lymph nodes.  LUNGS: clear to auscultation and percussion with normal breathing effort HEART: regular rate & rhythm and no murmurs. Bilateral lower extremity edema Musculoskeletal: no cyanosis of digits and no clubbing  PSYCH: alert & oriented x 3, fluent speech NEURO: no focal motor/sensory deficits  LABORATORY DATA:  I have reviewed the data as listed    Latest Ref Rng & Units 04/08/2024   11:10 AM 02/27/2023    8:48 AM 07/14/2021    9:39 AM  CBC  WBC 3.4 - 10.8 x10E3/uL 15.8  8.5  7.4   Hemoglobin 11.1 - 15.9 g/dL 89.3  87.4  86.9   Hematocrit 34.0 - 46.6 % 33.3  38.8  40.1   Platelets 150 - 450 x10E3/uL 472  320  335        Latest Ref Rng & Units 04/08/2024    11:10 AM 02/27/2023    8:48 AM 07/14/2021    9:39 AM  CMP  Glucose 70 - 99 mg/dL 889  93  83   BUN 6 - 20 mg/dL 8  10  12    Creatinine 0.57 - 1.00 mg/dL 9.44  9.22  9.23   Sodium 134 - 144 mmol/L 136  139  139   Potassium 3.5 - 5.2 mmol/L 3.6  4.2  4.3   Chloride 96 -  106 mmol/L 100  103  101   CO2 20 - 29 mmol/L 18  25  23    Calcium 8.7 - 10.2 mg/dL 9.4  9.7  9.5   Total Protein 6.0 - 8.5 g/dL 6.0  6.7  6.7   Total Bilirubin 0.0 - 1.2 mg/dL <9.7  0.3  0.5   Alkaline Phos 44 - 121 IU/L 219  67  68   AST 0 - 40 IU/L 33  13  18   ALT 0 - 32 IU/L 39  14  14     RADIOGRAPHIC STUDIES: I have personally reviewed the radiological images as listed and agreed with the findings in the report. No results found.  ASSESSMENT & PLAN Alyssa [name] is a 38 y.o. female who presents to the clinic for evaluation for leukocytosis.   #Leukocytosis-neutrophil predominant: --Most consistent with expected immune response during pregnancy. Similar presentation during her pregnancy in January 2019 that resolved after delivery. --Labs today to check CBC, CMP, peripheral smear, CRP and sed rate levels.  --Consider BCR/ABL FISH and MPN panel if there is persistent leukocytosis after delivery.   #Anemia: --Suspect iron deficiency with pregnancy.  --Labs today to check iron panel.  --Consider IV iron infusion to bolster iron levels if there is confirmed deficiency. Recommend IV feraheme 510 mg once weekly x 2 doses as patient is towards the end of her pregnancy currently 34 weeks this week. The is minimal benefit to receive IV iron after 36 weeks as hemoglobin will unlikely improve before her delivery.   #Thrombocytosis: --Likely secondary to iron deficiency versus inflammation --Monitor for now.   Orders Placed This Encounter  Procedures   CBC with Differential (Cancer Center Only)    Standing Status:   Future    Number of Occurrences:   1    Expected Date:   05/20/2024    Expiration Date:    08/18/2024   CMP (Cancer Center only)    Standing Status:   Future    Number of Occurrences:   1    Expected Date:   05/20/2024    Expiration Date:   08/18/2024   Iron and Iron Binding Capacity ([facility] only)    Standing Status:   Future    Number of Occurrences:   1    Expected Date:   05/20/2024    Expiration Date:   08/18/2024   Ferritin    Standing Status:   Future    Number of Occurrences:   1    Expected Date:   05/20/2024    Expiration Date:   08/18/2024   Sedimentation rate    Standing Status:   Future    Number of Occurrences:   1    Expected Date:   05/20/2024    Expiration Date:   08/18/2024   C-reactive protein    Standing Status:   Future    Number of Occurrences:   1    Expected Date:   05/20/2024    Expiration Date:   08/18/2024   Technologist smear review    Clinical information::   leukocytosis    All questions were answered. The patient knows to call the clinic with any problems, questions or concerns.  I have spent a total of 60 minutes minutes of face-to-face and non-face-to-face time, preparing to see the patient, obtaining and/or reviewing separately obtained history, performing a medically appropriate examination, counseling and educating the patient, ordering medications/tests/procedures, referring and communicating with other health care professionals, documenting clinical information  in the electronic health record, independently interpreting results and communicating results to the patient, and care coordination.   Johnston Police, PA-C Department of Hematology/Oncology [facility] at [facility] Phone: [phone]  Patient was seen with Dr. Federico  I have read the above note and personally examined the patient. I agree with the assessment and plan as noted above.  Briefly Alyssa [name] is a 38 year old female who presents for evaluation of hematological abnormalities in pregnancy.  She has a leukocytosis, thrombocytosis,  and a low hemoglobin.  At this time findings are most concerning for an iron deficiency anemia in pregnancy.  Additionally the patient does have a mild leukocytosis which I do suspect is a leukocytosis of pregnancy.  Reassuringly the patient did have a similar pattern with her prior pregnancy which is on file.  This was noted in 2019.  The patient voiced understanding of our findings and recommendations moving forward.  We would recommend IV iron therapy in the event her labs were to be consistent with iron deficiency anemia.  We will plan to have IV iron administered at the day hospital for appropriate fetal monitoring.   Norleen IVAR Federico, MD Department of Hematology/Oncology [facility] at [facility] Phone: [phone] Pager: [phone] Email: [email or web address]

## 2024-05-20 NOTE — Telephone Encounter (Signed)
 Auth Submission: NO AUTH NEEDED Site of care: Site of care: [facility] Payer: [employer] of [place] Medication & CPT/J Code(s) submitted: Feraheme (ferumoxytol) [id number] Diagnosis Code:  Route of submission (phone, fax, portal):  Phone # Fax # Auth type: Buy/Bill HB Units/visits requested: 510mg  x 2 doses Reference number:  Approval from: 05/20/24 to 08/20/24

## 2024-05-20 NOTE — Telephone Encounter (Signed)
 Scheduled appointment per 8/26 secure chat. Talked with the patient and she is aware of the made appointment.

## 2024-05-23 ENCOUNTER — Ambulatory Visit: Payer: Self-pay | Admitting: Physician Assistant

## 2024-05-29 ENCOUNTER — Inpatient Hospital Stay (HOSPITAL_COMMUNITY)
Admission: RE | Admit: 2024-05-29 | Discharge: 2024-05-29 | Disposition: A | Discharge status: Home / Self Care | Source: Ambulatory Visit | Attending: Physician Assistant

## 2024-05-29 DIAGNOSIS — D72829 Elevated white blood cell count, unspecified: Secondary | ICD-10-CM | POA: Diagnosis not present

## 2024-05-29 DIAGNOSIS — Z3A34 34 weeks gestation of pregnancy: Secondary | ICD-10-CM | POA: Insufficient documentation

## 2024-05-29 DIAGNOSIS — D509 Iron deficiency anemia, unspecified: Secondary | ICD-10-CM | POA: Insufficient documentation

## 2024-05-29 DIAGNOSIS — D75839 Thrombocytosis, unspecified: Secondary | ICD-10-CM | POA: Insufficient documentation

## 2024-05-29 DIAGNOSIS — D649 Anemia, unspecified: Secondary | ICD-10-CM | POA: Diagnosis not present

## 2024-05-29 MED ORDER — SODIUM CHLORIDE 0.9 % IV SOLN
510.0000 mg | INTRAVENOUS | Status: DC
  Administered 2024-05-29: 510 mg via INTRAVENOUS
  Filled 2024-05-29: qty 510

## 2024-05-30 ENCOUNTER — Inpatient Hospital Stay (HOSPITAL_COMMUNITY): Admission: RE | Admit: 2024-05-30 | Source: Ambulatory Visit

## 2024-06-03 ENCOUNTER — Telehealth: Payer: Self-pay | Admitting: Pharmacy Technician

## 2024-06-03 DIAGNOSIS — D509 Iron deficiency anemia, unspecified: Secondary | ICD-10-CM | POA: Insufficient documentation

## 2024-06-03 NOTE — Telephone Encounter (Signed)
 Auth Submission: NO AUTH NEEDED Site of care:  Payer: BCBS Medication & CPT/J Code(s) submitted: Feraheme (ferumoxytol ) [id number] Diagnosis Code: d50.9 Route of submission (phone, fax, portal):  Phone # Fax # Auth type: Buy/Bill PB Units/visits requested: x2 Reference number: Richard-H Approval from: 06/03/24 to 08/24/24

## 2024-06-04 ENCOUNTER — Encounter (HOSPITAL_COMMUNITY): Payer: Self-pay

## 2024-06-04 NOTE — Patient Instructions (Signed)
 Alyssa [name]  06/04/2024   Your procedure is scheduled on:  06/13/2024  Arrive at 0530 at [facility] [employer] on [address] at [facility]  and [employer]. You are invited to use the FREE valet parking or use the Visitor's parking deck.  Pick up the phone at the desk and dial [phone].  Call this number if you have problems the morning of surgery: [phone]  Remember:   Do not eat food:(After Midnight) Desps de medianoche.  You may drink clear liquids until  __0330___.  Clear liquids means a liquid you can see thru.  It can have color such as Cola or Kool aid.  Tea is OK and coffee as long as no milk or creamer of any kind.  Take these medicines the morning of surgery with A SIP OF WATER:  May take omeprazole .  Take ursodiol  as prescribed   Do not wear jewelry, make-up or nail polish.  Do not wear lotions, powders, or perfumes. Do not wear deodorant.  Do not shave 48 hours prior to surgery.  Do not bring valuables to the hospital.  [facility] is not   responsible for any belongings or valuables brought to the hospital.  Contacts, dentures or bridgework may not be worn into surgery.  Leave suitcase in the car. After surgery it may be brought to your room.  For patients admitted to the hospital, checkout time is 11:00 AM the day of              discharge.      Please read over the following fact sheets that you were given:     Preparing for Surgery

## 2024-06-04 NOTE — Therapy (Incomplete)
 OUTPATIENT PHYSICAL THERAPY BALANCE EVALUATION   Patient Name: Alyssa [name] MRN: [id number] DOB:[birth year], 38 y.o., female Today's Date: 06/04/2024  END OF SESSION:   Past Medical History:  Diagnosis Date   Acne    Anxiety    Asthma    onset as a baby   Cholestasis    Hepatitis A    age 30, in second grade   SVD (spontaneous vaginal delivery) 10/10/2017   Past Surgical History:  Procedure Laterality Date   WISDOM TOOTH EXTRACTION     Patient Active Problem List   Diagnosis Date Noted   Iron deficiency anemia, unspecified 06/03/2024   Placenta previa 03/19/2024   GERD (gastroesophageal reflux disease) 04/11/2022   Cholelithiases 09/06/2021   Ovarian cyst 08/31/2021   Asthma 07/12/2021   Vitamin D  deficiency 07/12/2021    PCP: Severa Rock HERO, FNP   REFERRING PROVIDER: Delana Ted Morrison, *   REFERRING DIAG: R9.81 (ICD-10-CM) - Unsteadiness on feet   THERAPY DIAG:  No diagnosis found.  Rationale for Evaluation and Treatment: Rehabilitation  ONSET DATE: ***  SUBJECTIVE:   SUBJECTIVE STATEMENT: ***  PERTINENT HISTORY: *** PAIN:  Are you having pain? {OPRCPAIN:27236}  PRECAUTIONS: {Therapy precautions:24002}  RED FLAGS: {PT Red Flags:29287}   WEIGHT BEARING RESTRICTIONS: {Yes ***/No:24003}  FALLS:  Has patient fallen in last 6 months? {fallsyesno:27318}  LIVING ENVIRONMENT: Lives with: {OPRC lives with:25569::lives with their family} Lives in: {Lives in:25570} Stairs: {opstairs:27293} Has following equipment at home: {Assistive devices:23999}  OCCUPATION: ***  PLOF: {PLOF:24004}  PATIENT GOALS: ***  NEXT MD VISIT: ***  OBJECTIVE:  Note: Objective measures were completed at Evaluation unless otherwise noted.  DIAGNOSTIC FINDINGS: ***  PATIENT SURVEYS:  {rehab surveys:24030}  COGNITION: Overall cognitive status: {cognition:24006}     SENSATION: {sensation:27233}  EDEMA:  {edema:24020}  MUSCLE  LENGTH: Hamstrings: Right *** deg; Left *** deg Debby test: Right *** deg; Left *** deg  POSTURE: {posture:25561}  PALPATION: ***  LOWER EXTREMITY ROM:  {AROM/PROM:27142} ROM Right eval Left eval  Hip flexion    Hip extension    Hip abduction    Hip adduction    Hip internal rotation    Hip external rotation    Knee flexion    Knee extension    Ankle dorsiflexion    Ankle plantarflexion    Ankle inversion    Ankle eversion     (Blank rows = not tested)  LOWER EXTREMITY MMT:  MMT Right eval Left eval  Hip flexion    Hip extension    Hip abduction    Hip adduction    Hip internal rotation    Hip external rotation    Knee flexion    Knee extension    Ankle dorsiflexion    Ankle plantarflexion    Ankle inversion    Ankle eversion     (Blank rows = not tested)  LOWER EXTREMITY SPECIAL TESTS:  {LEspecialtests:26242}  FUNCTIONAL TESTS:  {Functional tests:24029}  GAIT: Distance walked: *** Assistive device utilized: {Assistive devices:23999} Level of assistance: {Levels of assistance:24026} Comments: ***  TREATMENT DATE: ***    PATIENT EDUCATION:  Education details: *** Person educated: {Person educated:25204} Education method: {Education Method:25205} Education comprehension: {Education Comprehension:25206}  HOME EXERCISE PROGRAM: ***  ASSESSMENT:  CLINICAL IMPRESSION: Patient is a 38 y.o. female who was seen today for physical therapy evaluation and treatment for unsteadiness.   OBJECTIVE IMPAIRMENTS: {opptimpairments:25111}.   ACTIVITY LIMITATIONS: {activitylimitations:27494}  PARTICIPATION LIMITATIONS: {participationrestrictions:25113}  PERSONAL FACTORS: {Personal factors:25162} are also affecting patient's functional outcome.   REHAB POTENTIAL: {rehabpotential:25112}  CLINICAL DECISION MAKING: {clinical  decision making:25114}  EVALUATION COMPLEXITY: {Evaluation complexity:25115}   GOALS: Goals reviewed with patient? {yes/no:20286}  SHORT TERM GOALS: Target date: *** *** Baseline: Goal status: INITIAL  2.  *** Baseline:  Goal status: INITIAL  3.  *** Baseline:  Goal status: INITIAL  4.  *** Baseline:  Goal status: INITIAL  5.  *** Baseline:  Goal status: INITIAL  6.  *** Baseline:  Goal status: INITIAL  LONG TERM GOALS: Target date: ***  *** Baseline:  Goal status: INITIAL  2.  *** Baseline:  Goal status: INITIAL  3.  *** Baseline:  Goal status: INITIAL  4.  *** Baseline:  Goal status: INITIAL  5.  *** Baseline:  Goal status: INITIAL  6.  *** Baseline:  Goal status: INITIAL   PLAN:  PT FREQUENCY: {rehab frequency:25116}  PT DURATION: {rehab duration:25117}  PLANNED INTERVENTIONS: {rehab planned interventions:25118::97110-Therapeutic exercises,97530- Therapeutic [phone]- Neuromuscular re-education,97535- Self Rjmz,02859- Manual therapy}  PLAN FOR NEXT SESSION: ***   Aliveah Gallant, PT 06/04/2024, 8:58 AM

## 2024-06-05 ENCOUNTER — Ambulatory Visit: Admitting: Physical Therapy

## 2024-06-06 ENCOUNTER — Inpatient Hospital Stay: Payer: Self-pay

## 2024-06-06 ENCOUNTER — Encounter (HOSPITAL_COMMUNITY)
Admission: RE | Admit: 2024-06-06 | Discharge: 2024-06-06 | Disposition: A | Discharge status: Home / Self Care | Payer: Self-pay | Source: Ambulatory Visit | Attending: Physician Assistant | Admitting: Physician Assistant

## 2024-06-06 ENCOUNTER — Inpatient Hospital Stay (HOSPITAL_COMMUNITY): Admission: RE | Admit: 2024-06-06 | Source: Ambulatory Visit

## 2024-06-06 VITALS — BP 126/80 | HR 103 | Temp 98.5°F | Resp 16

## 2024-06-06 DIAGNOSIS — Z3A34 34 weeks gestation of pregnancy: Secondary | ICD-10-CM | POA: Insufficient documentation

## 2024-06-06 DIAGNOSIS — D75839 Thrombocytosis, unspecified: Secondary | ICD-10-CM | POA: Insufficient documentation

## 2024-06-06 DIAGNOSIS — D649 Anemia, unspecified: Secondary | ICD-10-CM | POA: Insufficient documentation

## 2024-06-06 DIAGNOSIS — D509 Iron deficiency anemia, unspecified: Secondary | ICD-10-CM | POA: Diagnosis not present

## 2024-06-06 DIAGNOSIS — D72829 Elevated white blood cell count, unspecified: Secondary | ICD-10-CM | POA: Insufficient documentation

## 2024-06-06 DIAGNOSIS — D539 Nutritional anemia, unspecified: Secondary | ICD-10-CM

## 2024-06-06 LAB — CMP (CANCER CENTER ONLY)
ALT: 39 U/L (ref 0–44)
AST: 31 U/L (ref 15–41)
Albumin: 3.2 g/dL — ABNORMAL LOW (ref 3.5–5.0)
Alkaline Phosphatase: 255 U/L — ABNORMAL HIGH (ref 38–126)
Anion gap: 10 (ref 5–15)
BUN: 9 mg/dL (ref 6–20)
CO2: 21 mmol/L — ABNORMAL LOW (ref 22–32)
Calcium: 8.9 mg/dL (ref 8.9–10.3)
Chloride: 104 mmol/L (ref 98–111)
Creatinine: 0.67 mg/dL (ref 0.44–1.00)
GFR, Estimated: >60 mL/min
Glucose, Bld: 112 mg/dL — ABNORMAL HIGH (ref 70–99)
Potassium: 3.3 mmol/L — ABNORMAL LOW (ref 3.5–5.1)
Sodium: 135 mmol/L (ref 135–145)
Total Bilirubin: 0.2 mg/dL (ref 0.0–1.2)
Total Protein: 6.5 g/dL (ref 6.5–8.1)

## 2024-06-06 LAB — CBC WITH DIFFERENTIAL (CANCER CENTER ONLY)
Abs Immature Granulocytes: 0.42 K/uL — ABNORMAL HIGH (ref 0.00–0.07)
Basophils Absolute: 0.1 K/uL (ref 0.0–0.1)
Basophils Relative: 1 %
Eosinophils Absolute: 0.1 K/uL (ref 0.0–0.5)
Eosinophils Relative: 1 %
HCT: 29.3 % — ABNORMAL LOW (ref 36.0–46.0)
Hemoglobin: 9.5 g/dL — ABNORMAL LOW (ref 12.0–15.0)
Immature Granulocytes: 3 %
Lymphocytes Relative: 17 %
Lymphs Abs: 2.6 K/uL (ref 0.7–4.0)
MCH: 26.5 pg (ref 26.0–34.0)
MCHC: 32.4 g/dL (ref 30.0–36.0)
MCV: 81.8 fL (ref 80.0–100.0)
Monocytes Absolute: 0.5 K/uL (ref 0.1–1.0)
Monocytes Relative: 3 %
Neutro Abs: 11.2 K/uL — ABNORMAL HIGH (ref 1.7–7.7)
Neutrophils Relative %: 75 %
Platelet Count: 398 K/uL (ref 150–400)
RBC: 3.58 MIL/uL — ABNORMAL LOW (ref 3.87–5.11)
RDW: 16.9 % — ABNORMAL HIGH (ref 11.5–15.5)
WBC Count: 14.8 K/uL — ABNORMAL HIGH (ref 4.0–10.5)
nRBC: 0.6 % — ABNORMAL HIGH (ref 0.0–0.2)

## 2024-06-06 LAB — IRON AND IRON BINDING CAPACITY (CC-WL,HP ONLY)
Iron: 146 ug/dL (ref 28–170)
Saturation Ratios: 23 % (ref 10.4–31.8)
TIBC: 645 ug/dL — ABNORMAL HIGH (ref 250–450)
UIBC: 499 ug/dL — ABNORMAL HIGH (ref 148–442)

## 2024-06-06 LAB — FERRITIN: Ferritin: 502 ng/mL — ABNORMAL HIGH (ref 11–307)

## 2024-06-06 MED ORDER — SODIUM CHLORIDE 0.9 % IV SOLN
510.0000 mg | Freq: Once | INTRAVENOUS | Status: AC
  Administered 2024-06-06: 510 mg via INTRAVENOUS
  Filled 2024-06-06: qty 510

## 2024-06-06 NOTE — Anesthesia Preprocedure Evaluation (Signed)
 Anesthesia Evaluation    Reviewed: Allergy & Precautions, Patient's Chart, lab work & pertinent test results  Airway        Dental   Pulmonary asthma           Cardiovascular negative cardio ROS      Neuro/Psych  PSYCHIATRIC DISORDERS Anxiety     negative neurological ROS     GI/Hepatic Neg liver ROS,GERD  Medicated and Controlled,,  Endo/Other  BMI 40  Renal/GU negative Renal ROS  negative genitourinary   Musculoskeletal negative musculoskeletal ROS (+)    Abdominal  (+) + obese  Peds  Hematology  (+) Blood dyscrasia, anemia Hb 9.5, plt 398   Anesthesia Other Findings   Reproductive/Obstetrics (+) Pregnancy Placenta previa                              Anesthesia Physical Anesthesia Plan  ASA: 3  Anesthesia Plan: Spinal   Post-op Pain Management: Regional block, Ofirmev  IV (intra-op)* and Toradol IV (intra-op)*   Induction:   PONV Risk Score and Plan: 3 and Ondansetron , Dexamethasone and Treatment may vary due to age or medical condition  Airway Management Planned: Natural Airway and Nasal Cannula  Additional Equipment: None  Intra-op Plan:   Post-operative Plan:   Informed Consent:   Plan Discussed with:   Anesthesia Plan Comments:         Anesthesia Quick Evaluation

## 2024-06-07 LAB — OB RESULTS CONSOLE GBS: GBS: POSITIVE

## 2024-06-11 ENCOUNTER — Encounter (HOSPITAL_COMMUNITY)
Admission: RE | Admit: 2024-06-11 | Discharge: 2024-06-11 | Disposition: A | Discharge status: Home / Self Care | Source: Ambulatory Visit | Attending: Family Medicine | Admitting: Family Medicine

## 2024-06-11 ENCOUNTER — Telehealth (HOSPITAL_COMMUNITY): Payer: Self-pay | Admitting: *Deleted

## 2024-06-11 NOTE — Telephone Encounter (Signed)
 Preadmission screen

## 2024-06-12 ENCOUNTER — Encounter (HOSPITAL_COMMUNITY): Payer: Self-pay | Admitting: *Deleted

## 2024-06-12 ENCOUNTER — Telehealth (HOSPITAL_COMMUNITY): Payer: Self-pay | Admitting: *Deleted

## 2024-06-12 NOTE — Telephone Encounter (Signed)
 Preadmission screen

## 2024-06-13 ENCOUNTER — Inpatient Hospital Stay (HOSPITAL_COMMUNITY): Admission: AD | Admit: 2024-06-13 | Source: Home / Self Care | Admitting: Obstetrics and Gynecology

## 2024-06-13 ENCOUNTER — Other Ambulatory Visit: Payer: Self-pay | Admitting: Obstetrics and Gynecology

## 2024-06-13 ENCOUNTER — Encounter (HOSPITAL_COMMUNITY): Admission: AD | Payer: Self-pay | Source: Home / Self Care

## 2024-06-13 ENCOUNTER — Encounter (HOSPITAL_COMMUNITY): Payer: Self-pay | Admitting: Anesthesiology

## 2024-06-13 DIAGNOSIS — O26643 Intrahepatic cholestasis of pregnancy, third trimester: Secondary | ICD-10-CM

## 2024-06-13 SURGERY — Surgical Case
Anesthesia: Spinal

## 2024-06-14 ENCOUNTER — Encounter (HOSPITAL_COMMUNITY)

## 2024-06-14 ENCOUNTER — Inpatient Hospital Stay (HOSPITAL_COMMUNITY)

## 2024-06-15 ENCOUNTER — Encounter (HOSPITAL_COMMUNITY): Payer: Self-pay | Admitting: Obstetrics and Gynecology

## 2024-06-15 ENCOUNTER — Inpatient Hospital Stay (HOSPITAL_COMMUNITY)
Admission: RE | Admit: 2024-06-15 | Discharge: 2024-06-17 | DRG: 807 | Disposition: A | Discharge status: Home / Self Care | Attending: Obstetrics and Gynecology | Admitting: Obstetrics and Gynecology

## 2024-06-15 ENCOUNTER — Inpatient Hospital Stay (HOSPITAL_COMMUNITY): Admitting: Anesthesiology

## 2024-06-15 ENCOUNTER — Other Ambulatory Visit: Payer: Self-pay

## 2024-06-15 DIAGNOSIS — E66813 Obesity, class 3: Secondary | ICD-10-CM | POA: Diagnosis present

## 2024-06-15 DIAGNOSIS — O9902 Anemia complicating childbirth: Secondary | ICD-10-CM | POA: Diagnosis present

## 2024-06-15 DIAGNOSIS — O99214 Obesity complicating childbirth: Secondary | ICD-10-CM | POA: Diagnosis present

## 2024-06-15 DIAGNOSIS — Z3A37 37 weeks gestation of pregnancy: Secondary | ICD-10-CM | POA: Diagnosis not present

## 2024-06-15 DIAGNOSIS — O99824 Streptococcus B carrier state complicating childbirth: Secondary | ICD-10-CM | POA: Diagnosis present

## 2024-06-15 DIAGNOSIS — O26643 Intrahepatic cholestasis of pregnancy, third trimester: Principal | ICD-10-CM | POA: Diagnosis present

## 2024-06-15 DIAGNOSIS — Z8249 Family history of ischemic heart disease and other diseases of the circulatory system: Secondary | ICD-10-CM

## 2024-06-15 LAB — TYPE AND SCREEN
ABO/RH(D): B POS
Antibody Screen: NEGATIVE

## 2024-06-15 LAB — COMPREHENSIVE METABOLIC PANEL WITH GFR
ALT: 112 U/L — ABNORMAL HIGH (ref 0–44)
AST: 70 U/L — ABNORMAL HIGH (ref 15–41)
Albumin: 2.3 g/dL — ABNORMAL LOW (ref 3.5–5.0)
Alkaline Phosphatase: 253 U/L — ABNORMAL HIGH (ref 38–126)
Anion gap: 12 (ref 5–15)
BUN: 7 mg/dL (ref 6–20)
CO2: 18 mmol/L — ABNORMAL LOW (ref 22–32)
Calcium: 9.2 mg/dL (ref 8.9–10.3)
Chloride: 104 mmol/L (ref 98–111)
Creatinine, Ser: 0.65 mg/dL (ref 0.44–1.00)
GFR, Estimated: >60 mL/min (ref >60)
Glucose, Bld: 92 mg/dL (ref 70–99)
Potassium: 3.5 mmol/L (ref 3.5–5.1)
Sodium: 134 mmol/L — ABNORMAL LOW (ref 135–145)
Total Bilirubin: 0.5 mg/dL (ref 0.0–1.2)
Total Protein: 6.3 g/dL — ABNORMAL LOW (ref 6.5–8.1)

## 2024-06-15 LAB — PROTEIN / CREATININE RATIO, URINE
Creatinine, Urine: 48 mg/dL
Protein Creatinine Ratio: 0.35 mg/mg{creat} — ABNORMAL HIGH (ref 0.00–0.15)
Total Protein, Urine: 17 mg/dL

## 2024-06-15 LAB — CBC
HCT: 32.4 % — ABNORMAL LOW (ref 36.0–46.0)
Hemoglobin: 10.5 g/dL — ABNORMAL LOW (ref 12.0–15.0)
MCH: 27.3 pg (ref 26.0–34.0)
MCHC: 32.4 g/dL (ref 30.0–36.0)
MCV: 84.4 fL (ref 80.0–100.0)
Platelets: 394 K/uL (ref 150–400)
RBC: 3.84 MIL/uL — ABNORMAL LOW (ref 3.87–5.11)
RDW: 21.9 % — ABNORMAL HIGH (ref 11.5–15.5)
WBC: 13.9 K/uL — ABNORMAL HIGH (ref 4.0–10.5)
nRBC: 0.4 % — ABNORMAL HIGH (ref 0.0–0.2)

## 2024-06-15 LAB — RPR: RPR Ser Ql: NONREACTIVE

## 2024-06-15 MED ORDER — OXYTOCIN-SODIUM CHLORIDE 30-0.9 UT/500ML-% IV SOLN
2.5000 [IU]/h | INTRAVENOUS | Status: DC
  Administered 2024-06-15: 2.5 [IU]/h via INTRAVENOUS

## 2024-06-15 MED ORDER — OXYCODONE-ACETAMINOPHEN 5-325 MG PO TABS: 1.0000 | ORAL_TABLET | ORAL | Status: DC | PRN

## 2024-06-15 MED ORDER — TETANUS-DIPHTH-ACELL PERTUSSIS 5-2.5-18.5 LF-MCG/0.5 IM SUSY: 0.5000 mL | PREFILLED_SYRINGE | Freq: Once | INTRAMUSCULAR | Status: DC

## 2024-06-15 MED ORDER — ACETAMINOPHEN 325 MG PO TABS
650.0000 mg | ORAL_TABLET | ORAL | Status: DC | PRN
  Administered 2024-06-15 – 2024-06-16 (×4): 650 mg via ORAL
  Filled 2024-06-15 (×4): qty 2

## 2024-06-15 MED ORDER — LACTATED RINGERS IV SOLN
INTRAVENOUS | Status: DC
Start: 2024-06-15 — End: 2024-06-15

## 2024-06-15 MED ORDER — TERBUTALINE SULFATE 1 MG/ML IJ SOLN
0.2500 mg | Freq: Once | INTRAMUSCULAR | Status: DC | PRN
Start: 2024-06-15 — End: 2024-06-15

## 2024-06-15 MED ORDER — PRENATAL MULTIVITAMIN CH
1.0000 | ORAL_TABLET | Freq: Every day | ORAL | Status: DC
Start: 2024-06-16 — End: 2024-06-17
  Administered 2024-06-16 – 2024-06-17 (×2): 1 via ORAL
  Filled 2024-06-15 (×2): qty 1

## 2024-06-15 MED ORDER — LIDOCAINE HCL (PF) 1 % IJ SOLN
30.0000 mL | INTRAMUSCULAR | Status: DC | PRN
Start: 2024-06-15 — End: 2024-06-15

## 2024-06-15 MED ORDER — IBUPROFEN 600 MG PO TABS
600.0000 mg | ORAL_TABLET | Freq: Four times a day (QID) | ORAL | Status: DC
Start: 2024-06-15 — End: 2024-06-17
  Administered 2024-06-15 – 2024-06-17 (×8): 600 mg via ORAL
  Filled 2024-06-15 (×8): qty 1

## 2024-06-15 MED ORDER — LIDOCAINE HCL (PF) 1 % IJ SOLN
INTRAMUSCULAR | Status: DC | PRN
  Administered 2024-06-15: 8 mL via EPIDURAL

## 2024-06-15 MED ORDER — ACETAMINOPHEN 325 MG PO TABS
650.0000 mg | ORAL_TABLET | ORAL | Status: DC | PRN
Start: 2024-06-15 — End: 2024-06-15

## 2024-06-15 MED ORDER — ZOLPIDEM TARTRATE 5 MG PO TABS
5.0000 mg | ORAL_TABLET | Freq: Once | ORAL | Status: AC
  Administered 2024-06-15: 5 mg via ORAL
  Filled 2024-06-15 (×2): qty 1

## 2024-06-15 MED ORDER — ALBUTEROL SULFATE (2.5 MG/3ML) 0.083% IN NEBU: 3.0000 mL | INHALATION_SOLUTION | Freq: Four times a day (QID) | RESPIRATORY_TRACT | Status: DC | PRN

## 2024-06-15 MED ORDER — FENTANYL CITRATE (PF) 100 MCG/2ML IJ SOLN
50.0000 ug | INTRAMUSCULAR | Status: DC | PRN
  Filled 2024-06-15: qty 2

## 2024-06-15 MED ORDER — SOD CITRATE-CITRIC ACID 500-334 MG/5ML PO SOLN
30.0000 mL | ORAL | Status: DC | PRN
Start: 2024-06-15 — End: 2024-06-15

## 2024-06-15 MED ORDER — SODIUM CHLORIDE 0.9 % IV SOLN
5.0000 10*6.[IU] | Freq: Once | INTRAVENOUS | Status: AC
  Administered 2024-06-15: 5 10*6.[IU] via INTRAVENOUS
  Filled 2024-06-15: qty 5

## 2024-06-15 MED ORDER — ONDANSETRON HCL 4 MG/2ML IJ SOLN: 4.0000 mg | INTRAMUSCULAR | Status: DC | PRN

## 2024-06-15 MED ORDER — PHENYLEPHRINE 80 MCG/ML (10ML) SYRINGE FOR IV PUSH (FOR BLOOD PRESSURE SUPPORT)
80.0000 ug | PREFILLED_SYRINGE | INTRAVENOUS | Status: DC | PRN
  Filled 2024-06-15: qty 10

## 2024-06-15 MED ORDER — COCONUT OIL OIL
1.0000 | TOPICAL_OIL | Status: DC | PRN
  Administered 2024-06-16: 1 via TOPICAL

## 2024-06-15 MED ORDER — EPHEDRINE 5 MG/ML INJ: 10.0000 mg | INTRAVENOUS | Status: DC | PRN

## 2024-06-15 MED ORDER — DIBUCAINE (PERIANAL) 1 % EX OINT: 1.0000 | TOPICAL_OINTMENT | CUTANEOUS | Status: DC | PRN

## 2024-06-15 MED ORDER — WITCH HAZEL-GLYCERIN EX PADS: 1.0000 | MEDICATED_PAD | CUTANEOUS | Status: DC | PRN

## 2024-06-15 MED ORDER — FENTANYL-BUPIVACAINE-NACL 0.5-0.125-0.9 MG/250ML-% EP SOLN
12.0000 mL/h | EPIDURAL | Status: DC | PRN
  Administered 2024-06-15: 12 mL/h via EPIDURAL
  Filled 2024-06-15: qty 250

## 2024-06-15 MED ORDER — SENNOSIDES-DOCUSATE SODIUM 8.6-50 MG PO TABS
2.0000 | ORAL_TABLET | Freq: Every day | ORAL | Status: DC
  Administered 2024-06-16 – 2024-06-17 (×2): 2 via ORAL
  Filled 2024-06-15 (×2): qty 2

## 2024-06-15 MED ORDER — OXYTOCIN-SODIUM CHLORIDE 30-0.9 UT/500ML-% IV SOLN
1.0000 m[IU]/min | INTRAVENOUS | Status: DC
  Administered 2024-06-15: 2 m[IU]/min via INTRAVENOUS
  Filled 2024-06-15: qty 500

## 2024-06-15 MED ORDER — FENTANYL CITRATE (PF) 100 MCG/2ML IJ SOLN
100.0000 ug | Freq: Once | INTRAMUSCULAR | Status: AC
  Administered 2024-06-15: 100 ug via INTRAVENOUS

## 2024-06-15 MED ORDER — PENICILLIN G POT IN DEXTROSE 60000 UNIT/ML IV SOLN
3.0000 10*6.[IU] | INTRAVENOUS | Status: DC
  Administered 2024-06-15 (×2): 3 10*6.[IU] via INTRAVENOUS
  Filled 2024-06-15 (×2): qty 50

## 2024-06-15 MED ORDER — MISOPROSTOL 25 MCG QUARTER TABLET
25.0000 ug | ORAL_TABLET | Freq: Once | ORAL | Status: DC
  Filled 2024-06-15: qty 1

## 2024-06-15 MED ORDER — LACTATED RINGERS IV SOLN
500.0000 mL | Freq: Once | INTRAVENOUS | Status: AC
  Administered 2024-06-15: 500 mL via INTRAVENOUS

## 2024-06-15 MED ORDER — ONDANSETRON HCL 4 MG PO TABS: 4.0000 mg | ORAL_TABLET | ORAL | Status: DC | PRN

## 2024-06-15 MED ORDER — SIMETHICONE 80 MG PO CHEW: 80.0000 mg | CHEWABLE_TABLET | ORAL | Status: DC | PRN

## 2024-06-15 MED ORDER — ZOLPIDEM TARTRATE 5 MG PO TABS: 5.0000 mg | ORAL_TABLET | Freq: Every evening | ORAL | Status: DC | PRN

## 2024-06-15 MED ORDER — DIPHENHYDRAMINE HCL 50 MG/ML IJ SOLN: 12.5000 mg | INTRAMUSCULAR | Status: DC | PRN

## 2024-06-15 MED ORDER — OXYTOCIN BOLUS FROM INFUSION
333.0000 mL | Freq: Once | INTRAVENOUS | Status: AC
  Administered 2024-06-15: 333 mL via INTRAVENOUS

## 2024-06-15 MED ORDER — OXYCODONE-ACETAMINOPHEN 5-325 MG PO TABS: 2.0000 | ORAL_TABLET | ORAL | Status: DC | PRN

## 2024-06-15 MED ORDER — BENZOCAINE-MENTHOL 20-0.5 % EX AERO
1.0000 | INHALATION_SPRAY | CUTANEOUS | Status: DC | PRN
  Administered 2024-06-15: 1 via TOPICAL
  Filled 2024-06-15: qty 56

## 2024-06-15 MED ORDER — ONDANSETRON HCL 4 MG/2ML IJ SOLN
4.0000 mg | Freq: Four times a day (QID) | INTRAMUSCULAR | Status: DC | PRN
  Administered 2024-06-15: 4 mg via INTRAVENOUS
  Filled 2024-06-15: qty 2

## 2024-06-15 MED ORDER — PHENYLEPHRINE 80 MCG/ML (10ML) SYRINGE FOR IV PUSH (FOR BLOOD PRESSURE SUPPORT): 80.0000 ug | PREFILLED_SYRINGE | INTRAVENOUS | Status: DC | PRN

## 2024-06-15 MED ORDER — LACTATED RINGERS IV SOLN: 500.0000 mL | INTRAVENOUS | Status: DC | PRN

## 2024-06-15 MED ORDER — DIPHENHYDRAMINE HCL 25 MG PO CAPS: 25.0000 mg | ORAL_CAPSULE | Freq: Four times a day (QID) | ORAL | Status: DC | PRN

## 2024-06-15 NOTE — Progress Notes (Signed)
 Labor Note  S: comfortable with epidural  O: BP 127/78   Pulse 93   Temp 98.4 F (36.9 C) (Axillary)   Resp 14   Ht 5' 4 (1.626 m)   Wt 105.7 kg   SpO2 99%   BMI 40.01 kg/m  CE: 4-5/80/-3 per RN FHR: Baseline 125, +accels, -decels, mod variability TOCO q45m, pitocin  at 40mU/min  A/P: This is a 38 y.o. G2P1001 at [redacted]w[redacted]d  admitted for IOL for cholestasis. GBS pos.  FWB: cat 1 MWB: comfortable s/p epidural, normotensive over last two hours Labor course: S/p AROM, now on pitocin  and transitioning through latent labor. Continue to titrate, encourage position changes  Anticipate SVD

## 2024-06-15 NOTE — Anesthesia Preprocedure Evaluation (Signed)
 Anesthesia Evaluation  Patient identified by MRN, date of birth, ID band Patient awake    Reviewed: Allergy & Precautions, Patient's Chart, lab work & pertinent test results  Airway Mallampati: III  TM Distance: >3 FB Neck ROM: Full    Dental no notable dental hx.    Pulmonary asthma (childhood)    Pulmonary exam normal breath sounds clear to auscultation       Cardiovascular negative cardio ROS Normal cardiovascular exam Rhythm:Regular Rate:Normal     Neuro/Psych  PSYCHIATRIC DISORDERS Anxiety     negative neurological ROS     GI/Hepatic negative GI ROS, Neg liver ROS,,,  Endo/Other    Class 3 obesity (BMI 40)  Renal/GU negative Renal ROS  negative genitourinary   Musculoskeletal negative musculoskeletal ROS (+)    Abdominal  (+) + obese  Peds negative pediatric ROS (+)  Hematology  (+) Blood dyscrasia, anemia Hb 10.5, plt 394   Anesthesia Other Findings   Reproductive/Obstetrics (+) Pregnancy                              Anesthesia Physical Anesthesia Plan  ASA: 3  Anesthesia Plan: Epidural   Post-op Pain Management:    Induction:   PONV Risk Score and Plan: 2  Airway Management Planned: Natural Airway  Additional Equipment: None  Intra-op Plan:   Post-operative Plan:   Informed Consent: I have reviewed the patients History and Physical, chart, labs and discussed the procedure including the risks, benefits and alternatives for the proposed anesthesia with the patient or authorized representative who has indicated his/her understanding and acceptance.       Plan Discussed with:   Anesthesia Plan Comments:         Anesthesia Quick Evaluation

## 2024-06-15 NOTE — Progress Notes (Signed)
 BP 139/79   Pulse (!) 115   Temp 97.6 F (36.4 C) (Oral)   Resp 18   Ht 5' 4 (1.626 m)   Wt 105.7 kg   SpO2 97%   BMI 40.01 kg/m  uPC returned as 0.35, was from Foley post epidural. Given that transaminitis is a chronic issue, continue to monitor BP, normotensive in last hour. Denies severe features

## 2024-06-15 NOTE — H&P (Addendum)
 Alyssa [name] is a 38 y.o. female presenting for scheduled IOL. Was supposed to be AM on 9/20 but held 2/2 busy floor. +FM, denies VB, LOF, ctx.  PNC c/b 1) Cholestasis of pregnancy - had in G1 as well. 32wks BA 11.2 > 27.4 @ 34w, BPP weekly. 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 5) Mild transaminitis (ALT) + leukocytosis (16) +thrombocytosis @ 28 wk (485) - saw hematologist Dr Federico > f/u postpartum  GBS POS  OB History     Gravida  2   Para  1   Term  1   Preterm      AB      Living  1      SAB      IAB      Ectopic      Multiple  0   Live Births  1          Past Medical History:  Diagnosis Date   Acne    Anxiety    Asthma    onset as a baby   Cholestasis    Cholestasis during pregnancy    Hepatitis A    age 37, in second grade   SVD (spontaneous vaginal delivery) 10/10/2017   Past Surgical History:  Procedure Laterality Date   dental implant     WISDOM TOOTH EXTRACTION     Family History: family history includes Cancer in her maternal grandmother; Colon polyps in her mother; Healthy in her father, mother, and sister; Heart disease in her paternal grandfather and paternal grandmother. Social History:  reports that she has never smoked. She has never used smokeless tobacco. She reports that she does not drink alcohol and does not use drugs.     Maternal Diabetes: No 1hr 105 Genetic Screening: Declined Maternal Ultrasounds/Referrals: Normal Fetal Ultrasounds or other Referrals:  None Maternal Substance Abuse:  No Significant Maternal Medications:  Meds include: Other: Actigall  Significant Maternal Lab Results:  Group B Strep positive Number of Prenatal Visits:greater than 3 verified prenatal visits Maternal Vaccinations:declined Other  Comments:  None  Review of Systems  Constitutional:  Negative for chills and fever.  Respiratory:  Negative for shortness of breath.   Cardiovascular:  Negative for chest pain, palpitations and leg swelling.  Gastrointestinal:  Negative for abdominal pain and vomiting.  Neurological:  Negative for dizziness, weakness and headaches.  Psychiatric/Behavioral:  Negative for suicidal ideas.    Maternal Medical History:  Contractions: Frequency: rare.   Fetal activity: Perceived fetal activity is normal.   Prenatal Complications - Diabetes: none.   Dilation: 3 Effacement (%): 70 Station: -2 Exam by:: Jacoby Zanni, MD Blood pressure 128/70, pulse (!) 110, temperature 98 F (36.7 C), temperature source Oral, resp. rate 18, height 5' 4 (1.626 m), weight 105.7 kg, SpO2 99%, unknown if currently breastfeeding. Clear AROM @ 0630  Exam Physical Exam Constitutional:      General: She is not in acute distress.    Appearance: She is well-developed.  HENT:     Head: Normocephalic and atraumatic.  Eyes:     Pupils: Pupils are equal, round, and reactive to light.  Cardiovascular:     Rate and Rhythm: Normal rate and regular rhythm.     Heart sounds: No murmur heard.    No gallop.  Abdominal:     Tenderness: There is no abdominal tenderness. There is no guarding or rebound.  Genitourinary:    Vagina: Normal.  Musculoskeletal:        General: Normal range of motion.     Cervical back: Normal range of motion and neck supple.  Skin:    General: Skin is warm and dry.  Neurological:     Mental Status: She is alert and oriented to person, place, and time.     Prenatal labs: ABO, Rh: --/--/B POS (09/21 9776) Antibody: NEG (09/21 0223) Rubella: Nonimmune (03/03 0000) RPR: Nonreactive (03/03 0000)  HBsAg: Negative (03/03 0000)  HIV: Non-reactive (03/03 0000)  GBS: Positive/-- (09/13 0000)  TOCO q4 with pitocin  at 84mU/min Cat 1 tracing, baseline 115  Assessment/Plan: This is a 38yo  G2P1001 @ 37 2/7 by LMP c/w 8wk TVUS presenting for IOL for cholestasis of pregnancy. GBS pos -S/p epidural -On pitocin , s/p AROM after second does GBS -Pelvis proven to 6lb7oz  Of note, patient highly anxious regarding delivery but feels more calm after completion of epidural. Diastolic BP elevated to 90s post-epiural, pt otherwise asymptomatic. CMP and uPC sent off to lab with admit orders ADDENDUM - CMP resulted with AST/ALT 70/112 and alk phos 253. Of note this is stable from CMP 3wks ago with heme showing AST/ALT 80/113 and al phos 276. CMP from office 2wks ago has AST/ALT 62/85. Likely a secondary rise from known cholestasis. No elevated BP during pregnancy nor history of GHTN in prior pregnancy. Close eye on status  Anticipate SVD   Alyssa [name] 06/15/2024, 6:40 AM

## 2024-06-15 NOTE — Anesthesia Procedure Notes (Signed)
 Epidural Patient location during procedure: OB Start time: 06/15/2024 3:07 AM End time: 06/15/2024 3:19 AM  Staffing Anesthesiologist: Merla Almarie HERO, DO Performed: anesthesiologist   Preanesthetic Checklist Completed: patient identified, IV checked, risks and benefits discussed, monitors and equipment checked, pre-op evaluation and timeout performed  Epidural Patient position: sitting Prep: DuraPrep and site prepped and draped Patient monitoring: continuous pulse ox, blood pressure, heart rate and cardiac monitor Approach: midline Location: L3-L4 Injection technique: LOR air  Needle:  Needle type: Tuohy  Needle gauge: 17 G Needle length: 9 cm Needle insertion depth: 8 cm Catheter type: closed end flexible Catheter size: 19 Gauge Catheter at skin depth: 13 cm Test dose: negative  Assessment Sensory level: T8 Events: blood not aspirated, no cerebrospinal fluid, injection not painful, no injection resistance, no paresthesia and negative IV test  Additional Notes Patient identified. Risks/Benefits/Options discussed with patient including but not limited to bleeding, infection, nerve damage, paralysis, failed block, incomplete pain control, headache, blood pressure changes, nausea, vomiting, reactions to medication both or allergic, itching and postpartum back pain. Confirmed with bedside nurse the patient's most recent platelet count. Confirmed with patient that they are not currently taking any anticoagulation, have any bleeding history or any family history of bleeding disorders. Patient expressed understanding and wished to proceed. All questions were answered. Sterile technique was used throughout the entire procedure. Please see nursing notes for vital signs. Test dose was given through epidural catheter and negative prior to continuing to dose epidural or start infusion. Warning signs of high block given to the patient including shortness of breath, tingling/numbness in  hands, complete motor block, or any concerning symptoms with instructions to call for help. Patient was given instructions on fall risk and not to get out of bed. All questions and concerns addressed with instructions to call with any issues or inadequate analgesia.  Reason for block:procedure for pain

## 2024-06-16 LAB — CBC
HCT: 28.1 % — ABNORMAL LOW (ref 36.0–46.0)
Hemoglobin: 8.9 g/dL — ABNORMAL LOW (ref 12.0–15.0)
MCH: 27.8 pg (ref 26.0–34.0)
MCHC: 31.7 g/dL (ref 30.0–36.0)
MCV: 87.8 fL (ref 80.0–100.0)
Platelets: 349 K/uL (ref 150–400)
RBC: 3.2 MIL/uL — ABNORMAL LOW (ref 3.87–5.11)
RDW: 22.5 % — ABNORMAL HIGH (ref 11.5–15.5)
WBC: 16.6 K/uL — ABNORMAL HIGH (ref 4.0–10.5)
nRBC: 0.1 % (ref 0.0–0.2)

## 2024-06-16 LAB — COMPREHENSIVE METABOLIC PANEL WITH GFR
ALT: 100 U/L — ABNORMAL HIGH (ref 0–44)
AST: 59 U/L — ABNORMAL HIGH (ref 15–41)
Albumin: 1.8 g/dL — ABNORMAL LOW (ref 3.5–5.0)
Alkaline Phosphatase: 209 U/L — ABNORMAL HIGH (ref 38–126)
Anion gap: 9 (ref 5–15)
BUN: 7 mg/dL (ref 6–20)
CO2: 19 mmol/L — ABNORMAL LOW (ref 22–32)
Calcium: 8.5 mg/dL — ABNORMAL LOW (ref 8.9–10.3)
Chloride: 108 mmol/L (ref 98–111)
Creatinine, Ser: 0.87 mg/dL (ref 0.44–1.00)
GFR, Estimated: >60 mL/min (ref >60)
Glucose, Bld: 99 mg/dL (ref 70–99)
Potassium: 3.4 mmol/L — ABNORMAL LOW (ref 3.5–5.1)
Sodium: 136 mmol/L (ref 135–145)
Total Bilirubin: 0.4 mg/dL (ref 0.0–1.2)
Total Protein: 5.1 g/dL — ABNORMAL LOW (ref 6.5–8.1)

## 2024-06-16 NOTE — Progress Notes (Signed)
 CSW received a consult for hx of PPD and met MOB at bedside to complete a mental health assessment. CSW entered the room, introduced herself and acknowledged that FOB was present holding the infant. MOB gave CSW verbal permission to speak about anything while FOB was present. CSW explained her role and the reason for the visit. MOB was polite, easy to engage, receptive to meeting with CSW, and appeared forthcoming.  CSW acknowledged Edinburgh score of 11. Patient declines a referral to [employer]. Patient verbalizes understanding that the appointment will be virtual. CSW inquired about MOB's mental health history. MOB reported experiencing anxiety and depression prior to pregnancy for years. MOB reported after her first pregnancy 7 years ago she experienced PPD with symptoms that included consistent sadness and feeling unmotivated. MOB reported reaching out to her OB and was able to begin medication for support. MOB reported discontinuing her medication regiment due to not liking how the medication made her feel. MOB reported overtime she has coped with her mental health by getting out of the house and speaking with friends.  CSW asked MOB if therapy resources would be needed for this PP period; MOB declined. MOB reported her supports as FOB, her mom and church family. CSW provided education regarding the baby blues period vs. perinatal mood disorders, discussed treatment and gave resources for mental health follow up if concerns arise.  CSW recommends self-evaluation during the postpartum time period using the New Mom Checklist from Postpartum Progress and encouraged MOB to contact a medical professional if symptoms are noted at any time.  CSW assessed for safety with MOB SI and HI; MOB denied all. CSW did not assess for DV; FOB was present.  CSW asked MOB has she selected a pediatrician for the infant's follow up visits; MOB said [facility].  MOB reported  having all essential items for the infant including a carseat, bassinet and crib for safe sleeping. CSW provided review of Sudden Infant Death Syndrome (SIDS) precautions.   CSW identifies no further need for intervention and no barriers to discharge at this time.  Rosina Molt, [place] Clinical Social Worker [phone]

## 2024-06-16 NOTE — Anesthesia Postprocedure Evaluation (Signed)
 Anesthesia Post Note  Patient: Alyssa [name]  Procedure(s) Performed: AN AD HOC LABOR EPIDURAL     Patient location during evaluation: Mother Baby Anesthesia Type: Epidural Level of consciousness: awake and alert Pain management: pain level controlled Vital Signs Assessment: post-procedure vital signs reviewed and stable Respiratory status: spontaneous breathing, nonlabored ventilation and respiratory function stable Cardiovascular status: stable Postop Assessment: no headache, no backache and epidural receding Anesthetic complications: no   No notable events documented.  Last Vitals:  Vitals:   06/16/24 0030 06/16/24 0500  BP: 115/65 130/81  Pulse: 92 84  Resp: 16 16  Temp: 36.5 C 36.5 C  SpO2: 100% 100%    Last Pain:  Vitals:   06/16/24 0822  TempSrc:   PainSc: 2    Pain Goal:                   [name]

## 2024-06-16 NOTE — Lactation Note (Signed)
 This note was copied from a baby's chart. Lactation Consultation Note  Patient Name: Alyssa [name] Date: 06/16/2024 Age:37 hours, P2  Reason for consult: Initial assessment;Early term 37-38.6wks;Infant weight loss (3 % weight loss) LC reviewed and updated the doc flow sheets per parents from the yellow worksheet.  The baby has breast fed x 8 in the last 26 hours, and has 4 stools and  3 wets.  LC reassured parents according to the doc flow sheets is breast feeding well.  LC reviewed 24 hours breast feeding goals - feed with feeding cues and by 3 hours offer the breast STS.  Mom aware the nurses or LC's can assist with latch.  Maternal Data Does the patient have breastfeeding experience prior to this delivery?: Yes How long did the patient breastfeed?: per mom 1st baby ( now 16 year old ) only 3 weeks and switched to formula  Feeding Mother's Current Feeding Choice: Breast Milk  LATCH Score - none as of yet    Lactation Tools Discussed/Used  None needed as of yet   Interventions  Education, Hand outs for storage of breast milk.    Discharge Pump: Personal WIC Program: No  Consult Status Consult Status: Follow-up Date: 06/17/24 Follow-up type: In-patient    Alyssa [name] 06/16/2024, 5:12 PM

## 2024-06-16 NOTE — Progress Notes (Signed)
 Post Partum Day 1 Subjective: She reports pain well controlled on ibuprofen /tylenol  combo. She is bonding well with baby but working on latching/breastfeeding. She denies CP, SOB. She would like to stay till tomorrow   Objective: Blood pressure 130/81, pulse 84, temperature 97.7 F (36.5 C), temperature source Oral, resp. rate 16, height 5' 4 (1.626 m), weight 105.7 kg, SpO2 100%, unknown if currently breastfeeding.  Physical Exam:  General: alert, cooperative, and no distress Lochia: appropriate Uterine Fundus: firm Incision: n/a DVT Evaluation: No evidence of DVT seen on physical exam.  Recent Labs    06/15/24 0224 06/16/24 0447  HGB 10.5* 8.9*  HCT 32.4* 28.1*    Assessment/Plan: Plan for discharge tomorrow and Breastfeeding Recheck LFTs tomorrow to monitor for continued downward trend  Routine pp care   LOS: 1 day   Alyssa Mitchum W Amayrani Bennick, DO 06/16/2024, 11:17 AM

## 2024-06-17 LAB — HEPATIC FUNCTION PANEL
ALT: 83 U/L — ABNORMAL HIGH (ref 0–44)
AST: 47 U/L — ABNORMAL HIGH (ref 15–41)
Albumin: 2 g/dL — ABNORMAL LOW (ref 3.5–5.0)
Alkaline Phosphatase: 215 U/L — ABNORMAL HIGH (ref 38–126)
Bilirubin, Direct: 0.2 mg/dL (ref 0.0–0.2)
Indirect Bilirubin: 0.3 mg/dL (ref 0.3–0.9)
Total Bilirubin: 0.5 mg/dL (ref 0.0–1.2)
Total Protein: 5.3 g/dL — ABNORMAL LOW (ref 6.5–8.1)

## 2024-06-17 LAB — BIRTH TISSUE RECOVERY COLLECTION (PLACENTA DONATION)

## 2024-06-17 MED ORDER — MEASLES, MUMPS & RUBELLA VAC IJ SOLR: 0.5000 mL | Freq: Once | INTRAMUSCULAR | Status: DC

## 2024-06-17 MED ORDER — IBUPROFEN 200 MG PO TABS: 600.0000 mg | ORAL_TABLET | Freq: Four times a day (QID) | ORAL | Status: AC | PRN

## 2024-06-17 MED ORDER — ACETAMINOPHEN 325 MG PO TABS: 650.0000 mg | ORAL_TABLET | Freq: Four times a day (QID) | ORAL | Status: AC | PRN

## 2024-06-17 NOTE — Lactation Note (Signed)
 This note was copied from a baby's chart. Lactation Consultation Note  Patient Name: Alyssa [name] Date: 06/17/2024 Age:37 hours Reason for consult: Follow-up assessment;Early term 37-38.6wks;Infant weight loss;Nipple pain/trauma;Breastfeeding assistance See below for Latch score of 8.  Worked on positioning and depth. Increased swallows noted and per mom comfortable.  LC reviewed breast feeding D/C teaching and the [facility] resources.   Maternal Data Has patient been taught Hand Expression?: Yes Does the patient have breastfeeding experience prior to this delivery?: Yes  Feeding Mother's Current Feeding Choice: Breast Milk and Formula Nipple Type: Slow - flow  LATCH Score Latch: Grasps breast easily, tongue down, lips flanged, rhythmical sucking.  Audible Swallowing: Spontaneous and intermittent  Type of Nipple: Everted at rest and after stimulation  Comfort (Breast/Nipple): Soft / non-tender  Hold (Positioning): Full assist, staff holds infant at breast  LATCH Score: 8   Lactation Tools Discussed/Used  Hand pump , shells , Flange #18 F , #21 F   Interventions Interventions: Breast feeding basics reviewed;Assisted with latch;Skin to skin;Breast massage;Hand express;Pre-pump if needed;Reverse pressure;Breast compression;Adjust position;Support pillows;Position options;Shells;Hand pump;Education;LC Services brochure;CDC milk storage guidelines;CDC Guidelines for Breast Pump Cleaning  Discharge Discharge Education: Engorgement and breast care;Warning signs for feeding baby;Outpatient recommendation;Other (comment) (mom declined request for the [facility] apt) Pump: DEBP;Personal;Manual  Consult Status Consult Status: Complete Date: 06/17/24    Rollene Jenkins Fiedler 06/17/2024, 12:44 PM

## 2024-06-17 NOTE — Discharge Summary (Signed)
 Postpartum Discharge Summary  Date of Service updated 06/17/24      Patient Name: Alyssa [name] DOB: 03-15-1986 MRN: [id number]  Date of admission: 06/15/2024 Delivery date:06/15/2024 Delivering provider: SUDIE LAVONIA HERO Date of discharge: 06/17/2024  Admitting diagnosis: Cholestasis during pregnancy in third trimester [O26.643] Intrauterine pregnancy: [redacted]w[redacted]d     Secondary diagnosis:  Principal Problem:   Cholestasis during pregnancy in third trimester  Additional problems: none    Discharge diagnosis: Term Pregnancy Delivered and Anemia                                              Post partum procedures:none Augmentation: AROM and Pitocin  Complications: None  Hospital course: Induction of Labor With Vaginal Delivery   38 y.o. yo G2P2002 at [redacted]w[redacted]d was admitted to the hospital 06/15/2024 for induction of labor.  Indication for induction: Cholestasis of pregnancy.  Patient had an labor course complicated by nothing Membrane Rupture Time/Date: 6:30 AM,06/15/2024  Delivery Method:Vaginal, Spontaneous Operative Delivery:N/A Episiotomy: None Lacerations:  1st degree Details of delivery can be found in separate delivery note.  Patient had a postpartum course complicated by nothing. LFTs downtrending postpartum.  Patient is discharged home 06/17/24.  Newborn Data: Birth date:06/15/2024 Birth time:2:24 PM Gender:Female Living status:Living Apgars:9 ,9  Weight:3320 g  Magnesium Sulfate received: No BMZ received: No Rhophylac:N/A MMR: ordered T-DaP: declined Flu: declined RSV Vaccine received: No Transfusion:No Immunizations administered: Immunization History  Administered Date(s) Administered   Tdap 08/29/2017    Physical exam  Vitals:   06/16/24 0500 06/16/24 1422 06/16/24 2052 06/17/24 0551  BP: 130/81 129/68 129/85 126/78  Pulse: 84 100 96 98  Resp: 16 16 18 18   Temp: 97.7 F (36.5 C) 98.2 F (36.8 C) 98.1 F (36.7 C) 97.8 F (36.6 C)  TempSrc: Oral  Oral Oral Oral  SpO2: 100% 98%    Weight:      Height:       General: alert, cooperative, and no distress Lochia: appropriate Uterine Fundus: firm Incision: N/A DVT Evaluation: No evidence of DVT seen on physical exam. Labs: Lab Results  Component Value Date   WBC 16.6 (H) 06/16/2024   HGB 8.9 (L) 06/16/2024   HCT 28.1 (L) 06/16/2024   MCV 87.8 06/16/2024   PLT 349 06/16/2024      Latest Ref Rng & Units 06/17/2024    5:19 AM  CMP  Total Protein 6.5 - 8.1 g/dL 5.3   Total Bilirubin 0.0 - 1.2 mg/dL 0.5   Alkaline Phos 38 - 126 U/L 215   AST 15 - 41 U/L 47   ALT 0 - 44 U/L 83    Edinburgh Score:    06/16/2024    8:22 AM  Edinburgh Postnatal Depression Scale Screening Tool  I have been able to laugh and see the funny side of things. 0  I have looked forward with enjoyment to things. 0  I have blamed myself unnecessarily when things went wrong. 2  I have been anxious or worried for no good reason. 2  I have felt scared or panicky for no good reason. 2  Things have been getting on top of me. 2  I have been so unhappy that I have had difficulty sleeping. 1  I have felt sad or miserable. 1  I have been so unhappy that I have been crying. 1  The  thought of harming myself has occurred to me. 0  Edinburgh Postnatal Depression Scale Total 11      After visit meds:  Allergies as of 06/17/2024       Reactions   Sulfa Antibiotics    Rash   Elemental Sulfur Rash   Sulfur Rash        Medication List     TAKE these medications    acetaminophen  325 MG tablet Commonly known as: Tylenol  Take 2 tablets (650 mg total) by mouth every 6 (six) hours as needed for mild pain (pain score 1-3) or moderate pain (pain score 4-6).   albuterol  108 (90 Base) MCG/ACT inhaler Commonly known as: VENTOLIN  HFA Inhale 2 puffs into the lungs every 6 (six) hours as needed for wheezing or shortness of breath.   folic acid 1 MG tablet Commonly known as: FOLVITE Take 1 mg by mouth daily.    ibuprofen  200 MG tablet Commonly known as: ADVIL  Take 3 tablets (600 mg total) by mouth every 6 (six) hours as needed for moderate pain (pain score 4-6) or cramping.   MULTI-VITAMIN DAILY PO Multi Vitamin   omeprazole  20 MG capsule Commonly known as: PRILOSEC Take 1 capsule (20 mg total) by mouth at bedtime.   VITAMIN D  PO Take by mouth.         Discharge home in stable condition Infant Feeding: Breast Infant Disposition:home with mother Discharge instruction: per After Visit Summary and Postpartum booklet. Activity: Advance as tolerated. Pelvic rest for 6 weeks.  Diet: routine diet Anticipated Birth Control: Unsure Postpartum Appointment:6 weeks Additional Postpartum F/U: none Future Appointments: Future Appointments  Date Time Provider Department Center  10/09/2024 10:50 AM Severa Rock HERO, FNP [facility] [address]  10/21/2024 10:45 AM [facility] None  10/21/2024 11:20 AM Thayil, Johnston DASEN, PA-C [facility] None   Follow up Visit:  Follow-up Information     [facility]. Schedule an appointment as soon as possible for a visit in 6 week(s).   Contact information: [address]  [address] [place] [address] [phone]                     06/17/2024 Rosaline FORBES Chapel, MD

## 2024-06-17 NOTE — Anesthesia Postprocedure Evaluation (Signed)
 Anesthesia Post Note  Patient: Alyssa [name]  Procedure(s) Performed: AN AD HOC LABOR EPIDURAL     Patient location during evaluation: Mother Baby Anesthesia Type: Epidural Level of consciousness: awake, oriented and awake and alert Pain management: pain level controlled Vital Signs Assessment: post-procedure vital signs reviewed and stable Respiratory status: spontaneous breathing, nonlabored ventilation and respiratory function stable Cardiovascular status: stable Postop Assessment: adequate PO intake, patient able to bend at knees, able to ambulate, no apparent nausea or vomiting and no headache Anesthetic complications: no   No notable events documented.  Last Vitals:  Vitals:   06/16/24 2052 06/17/24 0551  BP: 129/85 126/78  Pulse: 96 98  Resp: 18 18  Temp: 36.7 C 36.6 C  SpO2:      Last Pain:  Vitals:   06/17/24 0551  TempSrc: Oral  PainSc:    Pain Goal:                   Naszir Cott

## 2024-06-17 NOTE — Progress Notes (Signed)
 Post Partum Day 2 Subjective: Patient is doing well this morning. Pain is controlled. Ambulating, voiding, tolerating PO. Minimal lochia. Breastfeeding.   Objective: Patient Vitals for the past 24 hrs:  BP Temp Temp src Pulse Resp SpO2  06/17/24 0551 126/78 97.8 F (36.6 C) Oral 98 18 --  06/16/24 2052 129/85 98.1 F (36.7 C) Oral 96 18 --  06/16/24 1422 129/68 98.2 F (36.8 C) Oral 100 16 98 %    Physical Exam:  General: alert, cooperative, and no distress Lochia: appropriate Uterine Fundus: firm DVT Evaluation: No evidence of DVT seen on physical exam.  Recent Labs    06/15/24 0224 06/16/24 0447  WBC 13.9* 16.6*  HGB 10.5* 8.9*  HCT 32.4* 28.1*  PLT 394 349    Recent Labs    06/15/24 0224 06/16/24 0447 06/17/24 0519  NA 134* 136  --   K 3.5 3.4*  --   CL 104 108  --   BUN 7 7  --   CREATININE 0.65 0.87  --   GLUCOSE 92 99  --   BILITOT 0.5 0.4 0.5  ALT 112* 100* 83*  AST 70* 59* 47*  ALKPHOS 253* 209* 215*  PROT 6.3* 5.1* 5.3*  ALBUMIN 2.3* 1.8* 2.0*    Recent Labs    06/15/24 0224 06/16/24 0447  CALCIUM 9.2 8.5*    No results for input(s): PROTIME, APTT, INR in the last 72 hours.  No results for input(s): PROTIME, APTT, INR, FIBRINOGEN in the last 72 hours. Assessment/Plan: Kerith Sherley Beverley 38 y.o. [id number] PPD#2 sp SVD 1. PPC: routine PP care 2. Rh pos 3. IHCP: LFTs downtrending  4. Dispo: ready for discharge home, instructions reviewed   LOS: 2 days   Rosaline FORBES Chapel 06/17/2024, 12:07 PM

## 2024-06-17 NOTE — Progress Notes (Addendum)
 Attempted to round- patient in shower. Partner reports she is doing well. Will return.  EMERSON Chapel MD 06/17/24 11:17 AM   Second attempt to round - lactation consul occurring.   EMERSON Chapel MD 06/17/24 11:42 AM

## 2024-06-17 NOTE — Addendum Note (Signed)
 Addendum  created 06/17/24 9162 by Jarryn Altland, Elida RAMAN, CRNA   Clinical Note Signed

## 2024-06-17 NOTE — Patient Instructions (Signed)
 If interested in an outpatient lactation consult in office or virtually please reach out to us  at [facility] (First Floor) [address] [place] Please call [phone] and press 4 for lactation.    Lactation support groups:  [facility], Tuesdays 10:00 am -12:00 pm at [address] on the second floor in the conference room, lactating parents and lap babies welcome.  [email or web address]  [email or web address] [place] CINDERELLA Cindy, IBCLC Center for [facility]   [facility] Lactation Support Group  Please join us  for our Center for [employer] Lactation Support Group at [employer] We meet every Tuesday at 10:00 am to 12:00 pm at [address] on the second floor in the conference room Lactating parents and lap babies are welcome, no registration is required, if you have a lactation pillow please bring

## 2024-06-21 ENCOUNTER — Inpatient Hospital Stay (HOSPITAL_COMMUNITY)
Admission: AD | Admit: 2024-06-21 | Discharge: 2024-06-21 | Disposition: A | Discharge status: Home / Self Care | Attending: Obstetrics and Gynecology | Admitting: Obstetrics and Gynecology

## 2024-06-21 ENCOUNTER — Encounter (HOSPITAL_COMMUNITY): Payer: Self-pay | Admitting: Obstetrics and Gynecology

## 2024-06-21 DIAGNOSIS — N939 Abnormal uterine and vaginal bleeding, unspecified: Secondary | ICD-10-CM

## 2024-06-21 LAB — CBC WITH DIFFERENTIAL/PLATELET
Abs Immature Granulocytes: 0.14 K/uL — ABNORMAL HIGH (ref 0.00–0.07)
Basophils Absolute: 0.1 K/uL (ref 0.0–0.1)
Basophils Relative: 1 %
Eosinophils Absolute: 0.2 K/uL (ref 0.0–0.5)
Eosinophils Relative: 2 %
HCT: 28.9 % — ABNORMAL LOW (ref 36.0–46.0)
Hemoglobin: 9.2 g/dL — ABNORMAL LOW (ref 12.0–15.0)
Immature Granulocytes: 1 %
Lymphocytes Relative: 27 %
Lymphs Abs: 2.8 K/uL (ref 0.7–4.0)
MCH: 27.9 pg (ref 26.0–34.0)
MCHC: 31.8 g/dL (ref 30.0–36.0)
MCV: 87.6 fL (ref 80.0–100.0)
Monocytes Absolute: 0.6 K/uL (ref 0.1–1.0)
Monocytes Relative: 6 %
Neutro Abs: 6.7 K/uL (ref 1.7–7.7)
Neutrophils Relative %: 63 %
Platelets: 445 K/uL — ABNORMAL HIGH (ref 150–400)
RBC: 3.3 MIL/uL — ABNORMAL LOW (ref 3.87–5.11)
RDW: 22.2 % — ABNORMAL HIGH (ref 11.5–15.5)
WBC: 10.5 K/uL (ref 4.0–10.5)
nRBC: 0 % (ref 0.0–0.2)

## 2024-06-21 NOTE — MAU Note (Addendum)
 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 Went home on Tuesday 06-17-2024.  All ok. Says yesterday - went shopping- at 12 noon was hurting- lower abd -4/10. Then at 5pm- when she sat and getting up- lower abd cramping worse . Then at 1030- had large  blood clot ( one size of quarter  and other small than golf ball ) x2-  and more VB on pad .  Breastfeeding.  Now- in Triage - VB- light amt red.  And feels lower abd cramps - 5/10.  Took XS Tyl 1 tab and Ibuprofen  - 400mg  at 830pm

## 2024-06-21 NOTE — MAU Provider Note (Addendum)
 History     CSN: [id number]  Arrival date and time: 06/21/24 0135 First Provider Initiated Contact with Patient   Chief Complaint  Patient presents with   Vaginal Bleeding   Abdominal Pain    HPI Alyssa [name] is a 38 y.o. [id number] at 5 days postpartum (06/15/2024) who presents to the Maternity Assessment Unit for PP vaginal bleeding. She was up walking and out of the house today when she noticed increased vaginal bleeding, clots x2, and lower abdominal pain. Clots were quarter and golf ball sized. Lochia had been improving until today.  ROS (+) abd pain (-) f/c, CP, SOB, dizziness, dysuria  Medications Prior to Admission  Medication Sig Dispense Refill Last Dose/Taking   acetaminophen  (TYLENOL ) 325 MG tablet Take 2 tablets (650 mg total) by mouth every 6 (six) hours as needed for mild pain (pain score 1-3) or moderate pain (pain score 4-6).   06/20/2024 at  8:30 PM   ibuprofen  (ADVIL ) 200 MG tablet Take 3 tablets (600 mg total) by mouth every 6 (six) hours as needed for moderate pain (pain score 4-6) or cramping.   06/20/2024 at  8:30 PM   Multiple Vitamin (MULTI-VITAMIN DAILY PO) Multi Vitamin   Past Month   ursodiol  (ACTIGALL ) 500 MG tablet Take 500 mg by mouth 3 (three) times daily.   Past Week   VITAMIN D  PO Take by mouth.   Past Month   albuterol  (VENTOLIN  HFA) 108 (90 Base) MCG/ACT inhaler Inhale 2 puffs into the lungs every 6 (six) hours as needed for wheezing or shortness of breath. 8 g 2 More than a month   folic acid (FOLVITE) 1 MG tablet Take 1 mg by mouth daily.   More than a month   omeprazole  (PRILOSEC) 20 MG capsule Take 1 capsule (20 mg total) by mouth at bedtime. 90 capsule 1 06/19/2024    Past Medical History:  Diagnosis Date   Acne    Anxiety    Asthma    onset as a baby   Cholestasis    Cholestasis during pregnancy    Hepatitis A    age 57, in second grade   SVD (spontaneous vaginal delivery) 10/10/2017    Past Surgical History:  Procedure  Laterality Date   dental implant     WISDOM TOOTH EXTRACTION       Allergies:  Allergies  Allergen Reactions   Sulfa Antibiotics     Rash   Elemental Sulfur Rash   Sulfur Rash    ROS reviewed and pertinent positives and negatives as documented in HPI.    Physical Exam  BP (!) 118/55 (BP Location: Right Arm)   Pulse 86   Temp 97.8 F (36.6 C) (Oral)   Resp 12   Ht 5' 4 (1.626 m)   Wt 100 kg   BMI 37.83 kg/m   Gen: alert, no acute distress CV: regular rate Resp: nonlabored Abd: F<U, tender centrally and suprapubic region without guarding; soft gaseous distention Extremities: trace pedal edema  Cervical Exam  N/A  FHT N/A  Labs --/--/B POS (09/21 9776)   Results for orders placed or performed during the hospital encounter of 06/21/24 (from the past 24 hours)  CBC with Differential/Platelet     Status: Abnormal   Collection Time: 06/21/24  3:36 AM  Result Value Ref Range   WBC 10.5 4.0 - 10.5 K/uL   RBC 3.30 (L) 3.87 - 5.11 MIL/uL   Hemoglobin 9.2 (L) 12.0 - 15.0 g/dL   HCT  28.9 (L) 36.0 - 46.0 %   MCV 87.6 80.0 - 100.0 fL   MCH 27.9 26.0 - 34.0 pg   MCHC 31.8 30.0 - 36.0 g/dL   RDW 77.7 (H) 88.4 - 84.4 %   Platelets 445 (H) 150 - 400 K/uL   nRBC 0.0 0.0 - 0.2 %   Neutrophils Relative % 63 %   Neutro Abs 6.7 1.7 - 7.7 K/uL   Lymphocytes Relative 27 %   Lymphs Abs 2.8 0.7 - 4.0 K/uL   Monocytes Relative 6 %   Monocytes Absolute 0.6 0.1 - 1.0 K/uL   Eosinophils Relative 2 %   Eosinophils Absolute 0.2 0.0 - 0.5 K/uL   Basophils Relative 1 %   Basophils Absolute 0.1 0.0 - 0.1 K/uL   Immature Granulocytes 1 %   Abs Immature Granulocytes 0.14 (H) 0.00 - 0.07 K/uL    Imaging No results found.   Assessment and Plan  MDM Alyssa [name] is a 38 y.o. G2P2002 at 5 days postpartum (06/15/2024), who presents to the MAU for VB.  Ddx: normal lochia, gas/constipation, UTI. Pt feeling reassured improved after stable Hgb. She has an iron infusion  scheduled, encouraged to keep this appt.  1. Vaginal bleeding (Primary) Hgb on day of DC 8.9. Today, 9.2.    Results pending at the time of DC: none Dispo: DC home in stable condition with return precautions discussed and included in AVS.    Alyssa Maier, DO FMOB Fellow, Faculty Practice [facility], Center for [facility]

## 2024-06-26 ENCOUNTER — Telehealth (HOSPITAL_COMMUNITY): Payer: Self-pay | Admitting: *Deleted

## 2024-06-26 NOTE — Telephone Encounter (Addendum)
 06/26/2024  Name: Alyssa [name] MRN: [id number] DOB: July 27, 1986  Reason for Call:  Transition of Care Hospital Discharge Call  Contact Status: Patient Contact Status: Complete  Language assistant needed: Interpreter Mode: Interpreter Not Needed        Follow-Up Questions: Do You Have Any Concerns About Your Health As You Heal From Delivery?: Yes What Concerns Do You Have About Your Health?: Patient reports that she is still having intermittent heavy bleeding. She also notes that color of lochia changes from brown to red. She has not noticed that either the increase in volume or the change in color back to red is related to increased activity, it seems to be random. Denies fevers, chills, or abdominal/uterine pain or tenderness. She was seen in MAU on 06/21/2024 for this same issue (and having passed clots earlier on the 27th). Advised that while her situation is not emergent, it would make sense to call her OB office and asked to be seen. Patient wonders if she may have retained placenta since ultrasound was not done in MAU to rule out that possibility. Encouraged patient to ask her OB about this. Do You Have Any Concerns About Your Infants Health?: No Patient verbalized that she intends to call her OB today.  Edinburgh Postnatal Depression Scale:  In the Past 7 Days: I have been able to laugh and see the funny side of things.: As much as I always could I have looked forward with enjoyment to things.: As much as I ever did I have blamed myself unnecessarily when things went wrong.: Yes, some of the time I have been anxious or worried for no good reason.: Yes, sometimes I have felt scared or panicky for no good reason.: Yes, sometimes Things have been getting on top of me.: No, most of the time I have coped quite well I have been so unhappy that I have had difficulty sleeping.: Not very often I have felt sad or miserable.: Not very often I have been so unhappy that I have been  crying.: Only occasionally The thought of harming myself has occurred to me.: Never Van Postnatal Depression Scale Total: (!) 10  PHQ2-9 Depression Scale:     Discharge Follow-up: Edinburgh score requires follow up?: Yes Provider notified of Edinburgh score?: Yes Have you already been referred for a counseling appointment?: No Patient was advised of the following resources:: Support Group, Breastfeeding Support Group (declines postpartum group information via email)  Post-discharge interventions: Reviewed Newborn Safe Sleep Practices Maternal Mental Health Resources provided  Mliss Sieve, RN 06/26/2024 12:07

## 2024-09-02 NOTE — Addendum Note (Signed)
 Addended by: DAYNE SHERRY RAMAN on: 09/02/2024 10:57 AM   Modules accepted: Orders

## 2024-10-09 ENCOUNTER — Encounter: Payer: Self-pay | Admitting: Family Medicine

## 2024-10-17 ENCOUNTER — Other Ambulatory Visit: Payer: Self-pay

## 2024-10-17 DIAGNOSIS — D539 Nutritional anemia, unspecified: Secondary | ICD-10-CM

## 2024-10-17 DIAGNOSIS — D72829 Elevated white blood cell count, unspecified: Secondary | ICD-10-CM

## 2024-10-21 ENCOUNTER — Inpatient Hospital Stay (HOSPITAL_BASED_OUTPATIENT_CLINIC_OR_DEPARTMENT_OTHER): Admitting: Physician Assistant

## 2024-10-21 ENCOUNTER — Ambulatory Visit: Payer: Self-pay | Admitting: Physician Assistant

## 2024-10-21 ENCOUNTER — Inpatient Hospital Stay: Attending: Physician Assistant

## 2024-10-21 VITALS — BP 124/66 | HR 92 | Temp 97.7°F | Resp 17 | Wt 215.2 lb

## 2024-10-21 DIAGNOSIS — D72829 Elevated white blood cell count, unspecified: Secondary | ICD-10-CM | POA: Diagnosis not present

## 2024-10-21 DIAGNOSIS — Z800 Family history of malignant neoplasm of digestive organs: Secondary | ICD-10-CM | POA: Insufficient documentation

## 2024-10-21 DIAGNOSIS — D539 Nutritional anemia, unspecified: Secondary | ICD-10-CM

## 2024-10-21 DIAGNOSIS — Z862 Personal history of diseases of the blood and blood-forming organs and certain disorders involving the immune mechanism: Secondary | ICD-10-CM | POA: Insufficient documentation

## 2024-10-21 DIAGNOSIS — Z79899 Other long term (current) drug therapy: Secondary | ICD-10-CM | POA: Insufficient documentation

## 2024-10-21 DIAGNOSIS — Z83719 Family history of colon polyps, unspecified: Secondary | ICD-10-CM | POA: Insufficient documentation

## 2024-10-21 LAB — CBC WITH DIFFERENTIAL (CANCER CENTER ONLY)
Abs Immature Granulocytes: 0.02 10*3/uL (ref 0.00–0.07)
Basophils Absolute: 0 10*3/uL (ref 0.0–0.1)
Basophils Relative: 1 %
Eosinophils Absolute: 0.1 10*3/uL (ref 0.0–0.5)
Eosinophils Relative: 2 %
HCT: 39.1 % (ref 36.0–46.0)
Hemoglobin: 12.8 g/dL (ref 12.0–15.0)
Immature Granulocytes: 0 %
Lymphocytes Relative: 19 %
Lymphs Abs: 1.4 10*3/uL (ref 0.7–4.0)
MCH: 26.8 pg (ref 26.0–34.0)
MCHC: 32.7 g/dL (ref 30.0–36.0)
MCV: 81.8 fL (ref 80.0–100.0)
Monocytes Absolute: 0.6 10*3/uL (ref 0.1–1.0)
Monocytes Relative: 8 %
Neutro Abs: 5.1 10*3/uL (ref 1.7–7.7)
Neutrophils Relative %: 70 %
Platelet Count: 346 10*3/uL (ref 150–400)
RBC: 4.78 MIL/uL (ref 3.87–5.11)
RDW: 15.2 % (ref 11.5–15.5)
WBC Count: 7.2 10*3/uL (ref 4.0–10.5)
nRBC: 0 % (ref 0.0–0.2)

## 2024-10-21 LAB — IRON AND IRON BINDING CAPACITY (CC-WL,HP ONLY)
Iron: 46 ug/dL (ref 28–170)
Saturation Ratios: 14 % (ref 10.4–31.8)
TIBC: 328 ug/dL (ref 250–450)
UIBC: 282 ug/dL

## 2024-10-21 LAB — CMP (CANCER CENTER ONLY)
ALT: 38 U/L (ref 0–44)
AST: 30 U/L (ref 15–41)
Albumin: 4.4 g/dL (ref 3.5–5.0)
Alkaline Phosphatase: 100 U/L (ref 38–126)
Anion gap: 12 (ref 5–15)
BUN: 15 mg/dL (ref 6–20)
CO2: 25 mmol/L (ref 22–32)
Calcium: 9.4 mg/dL (ref 8.9–10.3)
Chloride: 102 mmol/L (ref 98–111)
Creatinine: 0.81 mg/dL (ref 0.44–1.00)
GFR, Estimated: >60 mL/min
Glucose, Bld: 67 mg/dL — ABNORMAL LOW (ref 70–99)
Potassium: 3.8 mmol/L (ref 3.5–5.1)
Sodium: 139 mmol/L (ref 135–145)
Total Bilirubin: 0.4 mg/dL (ref 0.0–1.2)
Total Protein: 7.4 g/dL (ref 6.5–8.1)

## 2024-10-21 LAB — FERRITIN: Ferritin: 48 ng/mL (ref 11–307)

## 2024-10-21 NOTE — Progress Notes (Signed)
 " [facility] [phone]   [phone]  PROGRESS NOTE  Patient Care Team: Severa Rock HERO, FNP as PCP - General (Family Medicine)  CHIEF COMPLAINTS/PURPOSE OF CONSULTATION:  Leukocytosis during pregnancy  HISTORY OF PRESENTING ILLNESS:  Alyssa [name] 39 y.o. female returns for a follow up for leukocytosis during her pregnancy. She was last seen on 05/20/24 to establish care. In the interim, delivered a her baby on 06/15/2024. She is accompanied by her husband for this visit.   On exam today, Ms. Cuppett reports having fatigue but is able to complete her ADLs on her own. She was started on Zoloft for postpartum depression. She denies any appetite changes. She reports no infectious symptoms such as fevers, chills, cough or urinary symptoms. She is otherwise doing well without any new or concerning symptoms. Rest of the ROS is below.   MEDICAL HISTORY:  Past Medical History:  Diagnosis Date   Acne    Anxiety    Asthma    onset as a baby   Cholestasis    Cholestasis during pregnancy    Hepatitis A    age 63, in second grade   SVD (spontaneous vaginal delivery) 10/10/2017    SURGICAL HISTORY: Past Surgical History:  Procedure Laterality Date   dental implant     WISDOM TOOTH EXTRACTION      SOCIAL HISTORY: Social History   Socioeconomic History   Marital status: Married    Spouse name: Josh   Number of children: 1   Years of education: Not on file   Highest education level: Not on file  Occupational History   Occupation: stay at home mom  Tobacco Use   Smoking status: Never   Smokeless tobacco: Never  Vaping Use   Vaping status: Never Used  Substance and Sexual Activity   Alcohol use: No   Drug use: No   Sexual activity: Not on file  Other Topics Concern   Not on file  Social History Narrative   Not on file   Social Drivers of Health   Tobacco Use: Low Risk (06/15/2024)   Patient History    Smoking Tobacco Use: Never     Smokeless Tobacco Use: Never    Passive Exposure: Not on file  Financial Resource Strain: Not on file  Food Insecurity: No Food Insecurity (06/15/2024)   Epic    Worried About Programme Researcher, Broadcasting/film/video in the Last Year: Never true    Ran Out of Food in the Last Year: Never true  Transportation Needs: No Transportation Needs (06/15/2024)   Epic    Lack of Transportation (Medical): No    Lack of Transportation (Non-Medical): No  Physical Activity: Not on file  Stress: Not on file  Social Connections: Not on file  Intimate Partner Violence: Not At Risk (06/16/2024)   Epic    Fear of Current or Ex-Partner: No    Emotionally Abused: No    Physically Abused: No    Sexually Abused: No  Depression (PHQ2-9): Low Risk (05/20/2024)   Depression (PHQ2-9)    PHQ-2 Score: 0  Alcohol Screen: Not on file  Housing: Low Risk (06/15/2024)   Epic    Unable to Pay for Housing in the Last Year: No    Number of Times Moved in the Last Year: 0    Homeless in the Last Year: No  Utilities: Not At Risk (06/15/2024)   Epic    Threatened with loss of utilities: No  Health Literacy: Not  on file    FAMILY HISTORY: Family History  Problem Relation Age of Onset   Healthy Mother    Colon polyps Mother    Healthy Father    Healthy Sister    Cancer Maternal Grandmother        Colorectal   Heart disease Paternal Grandmother    Heart disease Paternal Grandfather     ALLERGIES:  is allergic to sulfa antibiotics, elemental sulfur, and sulfur.  MEDICATIONS:  Current Outpatient Medications  Medication Sig Dispense Refill   acetaminophen  (TYLENOL ) 325 MG tablet Take 2 tablets (650 mg total) by mouth every 6 (six) hours as needed for mild pain (pain score 1-3) or moderate pain (pain score 4-6).     albuterol  (VENTOLIN  HFA) 108 (90 Base) MCG/ACT inhaler Inhale 2 puffs into the lungs every 6 (six) hours as needed for wheezing or shortness of breath. 8 g 2   folic acid (FOLVITE) 1 MG tablet Take 1 mg by mouth daily.      ibuprofen  (ADVIL ) 200 MG tablet Take 3 tablets (600 mg total) by mouth every 6 (six) hours as needed for moderate pain (pain score 4-6) or cramping.     Multiple Vitamin (MULTI-VITAMIN DAILY PO) Multi Vitamin     omeprazole  (PRILOSEC) 20 MG capsule Take 1 capsule (20 mg total) by mouth at bedtime. 90 capsule 1   VITAMIN D  PO Take by mouth.     No current facility-administered medications for this visit.    REVIEW OF SYSTEMS:   Constitutional: ( - ) fevers, ( - )  chills , ( - ) night sweats Eyes: ( - ) blurriness of vision, ( - ) double vision, ( - ) watery eyes Ears, nose, mouth, throat, and face: ( - ) mucositis, ( - ) sore throat Respiratory: ( - ) cough, ( - ) dyspnea, ( - ) wheezes Cardiovascular: ( - ) palpitation, ( - ) chest discomfort, ( - ) lower extremity swelling Gastrointestinal:  ( - ) nausea, ( - ) heartburn, ( - ) change in bowel habits Skin: ( - ) abnormal skin rashes Lymphatics: ( - ) new lymphadenopathy, ( - ) easy bruising Neurological: ( - ) numbness, ( - ) tingling, ( - ) new weaknesses Behavioral/Psych: ( - ) mood change, ( - ) new changes  All other systems were reviewed with the patient and are negative.  PHYSICAL EXAMINATION: ECOG PERFORMANCE STATUS: 0 - Asymptomatic  Vitals:   10/21/24 1109  BP: 124/66  Pulse: 92  Resp: 17  Temp: 97.7 F (36.5 C)  SpO2: 98%   Filed Weights   10/21/24 1109  Weight: 215 lb 3.2 oz (97.6 kg)    GENERAL: well appearing female in NAD  SKIN: skin color, texture, turgor are normal, no rashes or significant lesions EYES: conjunctiva are pink and non-injected, sclera clear LUNGS: clear to auscultation and percussion with normal breathing effort HEART: regular rate & rhythm and no murmurs.  Musculoskeletal: no cyanosis of digits and no clubbing  PSYCH: alert & oriented x 3, fluent speech NEURO: no focal motor/sensory deficits  LABORATORY DATA:  I have reviewed the data as listed    Latest Ref Rng & Units 10/21/2024    10:29 AM 06/21/2024    3:36 AM 06/16/2024    4:47 AM  CBC  WBC 4.0 - 10.5 K/uL 7.2  10.5  16.6   Hemoglobin 12.0 - 15.0 g/dL 87.1  9.2  8.9   Hematocrit 36.0 - 46.0 % 39.1  28.9  28.1   Platelets 150 - 400 K/uL 346  445  349        Latest Ref Rng & Units 10/21/2024   10:29 AM 06/17/2024    5:19 AM 06/16/2024    4:47 AM  CMP  Glucose 70 - 99 mg/dL 67   99   BUN 6 - 20 mg/dL 15   7   Creatinine 9.55 - 1.00 mg/dL 9.18   9.12   Sodium 864 - 145 mmol/L 139   136   Potassium 3.5 - 5.1 mmol/L 3.8   3.4   Chloride 98 - 111 mmol/L 102   108   CO2 22 - 32 mmol/L 25   19   Calcium 8.9 - 10.3 mg/dL 9.4   8.5   Total Protein 6.5 - 8.1 g/dL 7.4  5.3  5.1   Total Bilirubin 0.0 - 1.2 mg/dL 0.4  0.5  0.4   Alkaline Phos 38 - 126 U/L 100  215  209   AST 15 - 41 U/L 30  47  59   ALT 0 - 44 U/L 38  83  100     RADIOGRAPHIC STUDIES: I have personally reviewed the radiological images as listed and agreed with the findings in the report. No results found.  ASSESSMENT & PLAN Aleasha Fregeau is a 39 y.o. female who presents to the clinic for evaluation for leukocytosis.   #Leukocytosis-neutrophil predominant: --Most consistent with expected immune response during pregnancy. Similar presentation during her pregnancy in January 2019 that resolved after delivery. --Labs today reviewed with patient that showed leukocytosis has resolved with WBC 7.2.  --No further workup required at this time --Return to clinic as needed.   #H/O iron deficiency anemia: --Suspect iron deficiency with pregnancy.  --Received IV feraheme 510 mg x 2 doses on 05/29/2024 and 06/06/2024 --Labs today show no evidence of anemia with Hgb 12.8, MCV 81.8. Iron panel shows saturation 14%, ferritin levels pending --Consider starting PO ferrous sulfate 325 mg once daily with a source of vitamin C based on pending labs.    Orders Placed This Encounter  Procedures   CBC with Differential (Cancer Center Only)    Standing  Status:   Future    Number of Occurrences:   1    Expiration Date:   10/20/2025   CMP (Cancer Center only)    Standing Status:   Future    Number of Occurrences:   1    Expiration Date:   10/20/2025   Ferritin    Standing Status:   Future    Number of Occurrences:   1    Expiration Date:   10/20/2025   Iron and Iron Binding Capacity ([facility] only)    Standing Status:   Future    Number of Occurrences:   1    Expiration Date:   10/20/2025    All questions were answered. The patient knows to call the clinic with any problems, questions or concerns.  I have spent a total of 25 minutes minutes of face-to-face and non-face-to-face time, preparing to see the patient, performing a medically appropriate examination, counseling and educating the patient,  documenting clinical information in the electronic health record, independently interpreting results and communicating results to the patient, and care coordination.   Johnston Police, PA-C Department of Hematology/Oncology [facility] Phone: [phone]    "
